# Patient Record
Sex: Male | Born: 1975 | Race: Black or African American | Hispanic: No | Marital: Married | Smoking: Current every day smoker
Health system: Southern US, Community
[De-identification: ages and names within clinical notes are randomized; demographics above are authoritative.]

## PROBLEM LIST (undated history)

## (undated) HISTORY — PX: APPENDECTOMY: SHX54

---

## 2004-02-13 ENCOUNTER — Observation Stay (HOSPITAL_COMMUNITY): Admission: EM | Admit: 2004-02-13 | Discharge: 2004-02-14 | Payer: Self-pay | Admitting: Emergency Medicine

## 2004-02-13 ENCOUNTER — Encounter (INDEPENDENT_AMBULATORY_CARE_PROVIDER_SITE_OTHER): Payer: Self-pay | Admitting: *Deleted

## 2006-04-22 ENCOUNTER — Emergency Department (HOSPITAL_COMMUNITY): Admission: EM | Admit: 2006-04-22 | Discharge: 2006-04-22 | Payer: Self-pay | Admitting: Emergency Medicine

## 2007-03-20 ENCOUNTER — Emergency Department (HOSPITAL_COMMUNITY): Admission: EM | Admit: 2007-03-20 | Discharge: 2007-03-20 | Payer: Self-pay | Admitting: Emergency Medicine

## 2007-08-28 ENCOUNTER — Emergency Department (HOSPITAL_COMMUNITY): Admission: EM | Admit: 2007-08-28 | Discharge: 2007-08-29 | Payer: Self-pay | Admitting: Emergency Medicine

## 2008-01-28 IMAGING — CT CT HEAD W/O CM
1 of 2 series · 16 of 30 positions shown, 20 images · IV contrast (agent unspecified)
Comparison: None.

CLINICAL DATA: 30-year-old male with headache.
 HEAD CT WITHOUT CONTRAST:
TECHNIQUE: Contiguous axial images were obtained from the base of the skull through the vertex according to standard protocol without contrast.

[Series 3: recon 2: brain · axial · 0.47mm/px · z∈[+155,+283]mm · 16 of 56 slices shown, 20 images]
[im 3/56  brain]
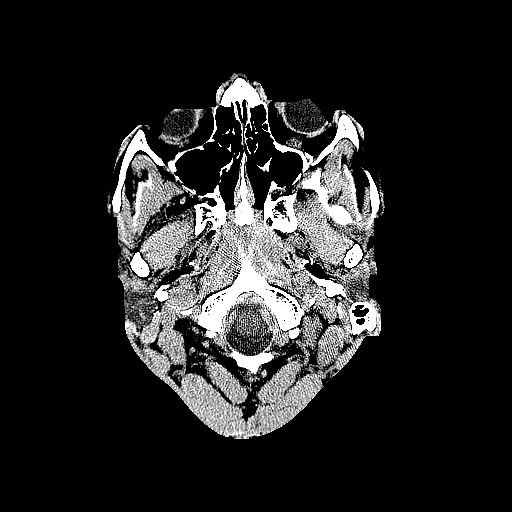
[im 3/56  bone]
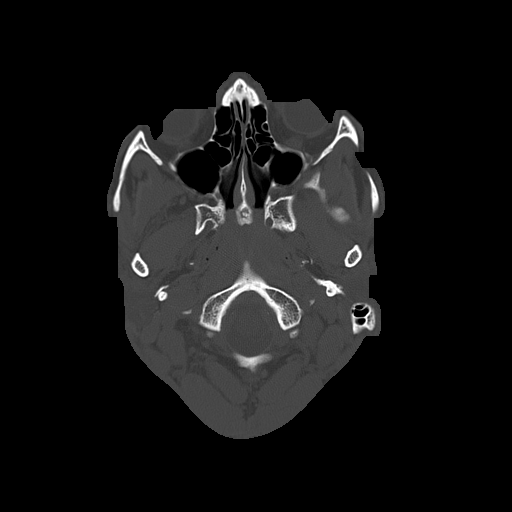
[im 6/56  brain]
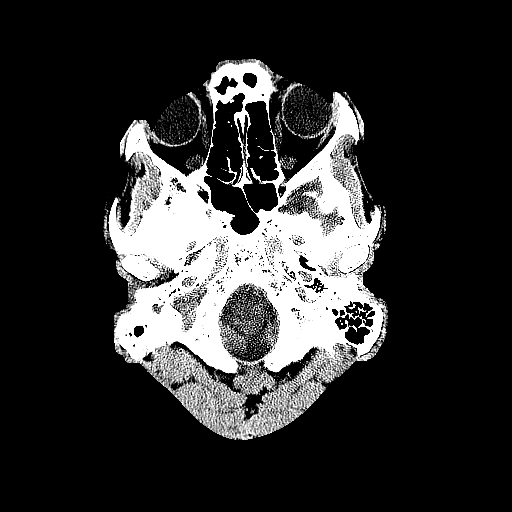
[im 9/56  brain]
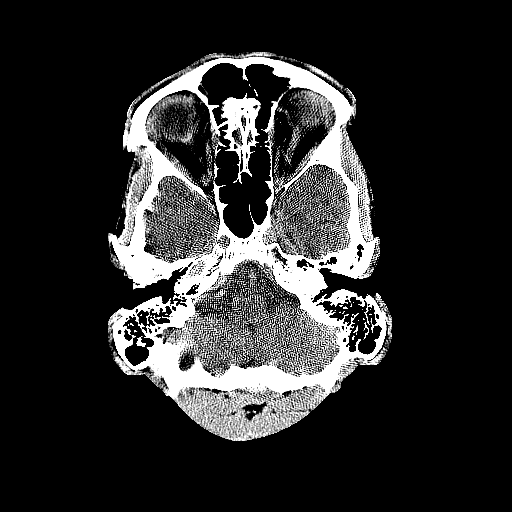
[im 12/56  brain]
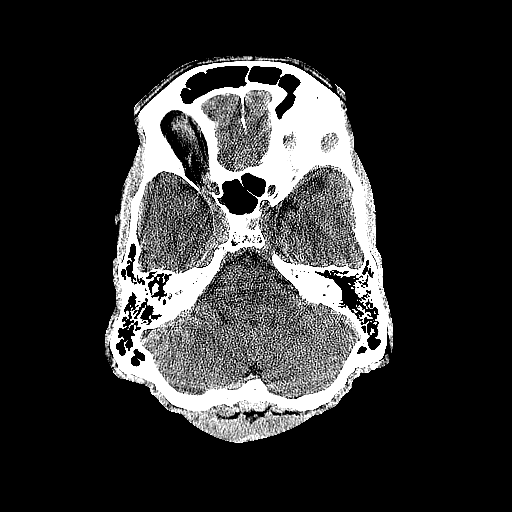
[im 18/56  brain]
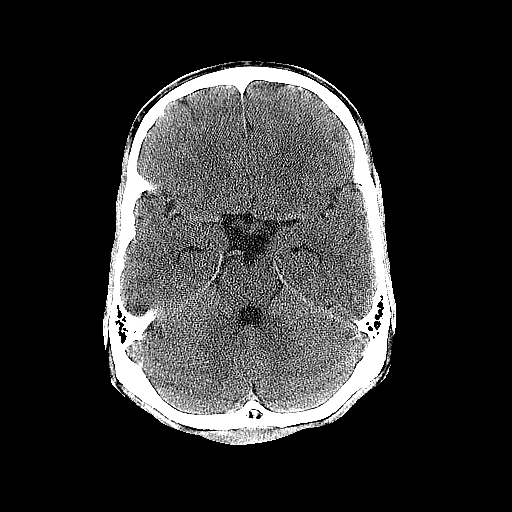
[im 18/56  bone]
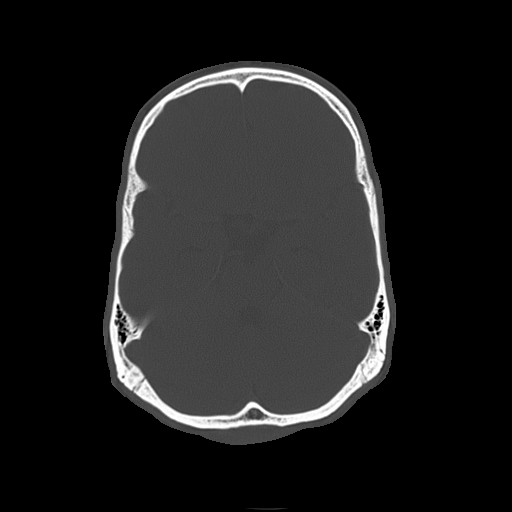
[im 21/56  brain]
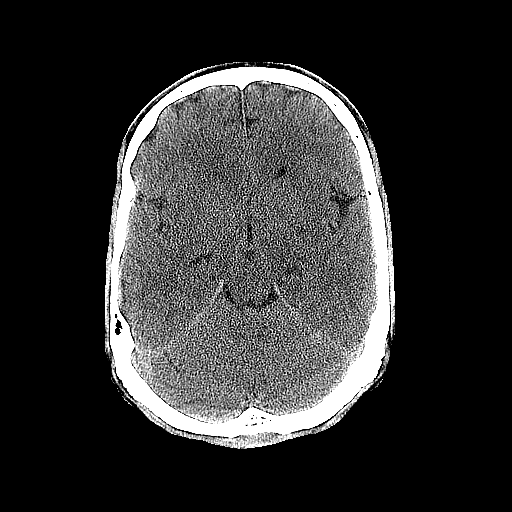
[im 24/56  brain]
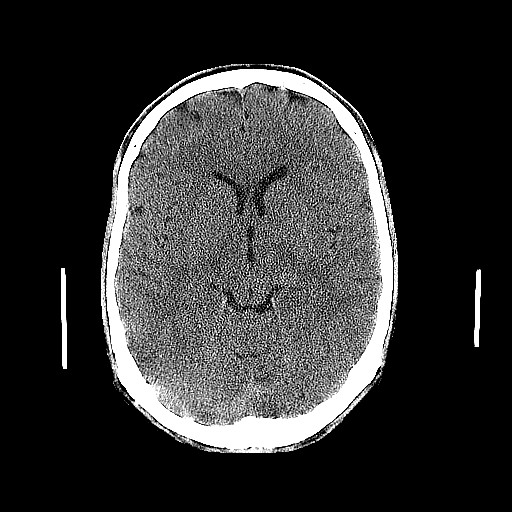
[im 27/56  brain]
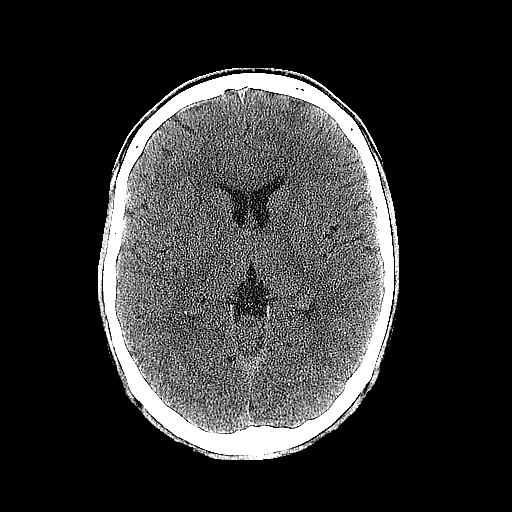
[im 29/56  brain]
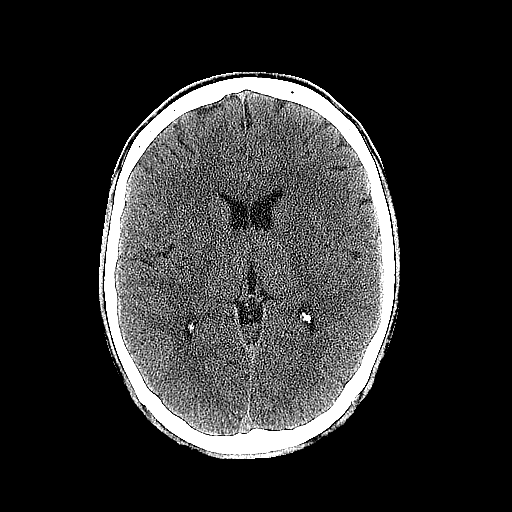
[im 29/56  bone]
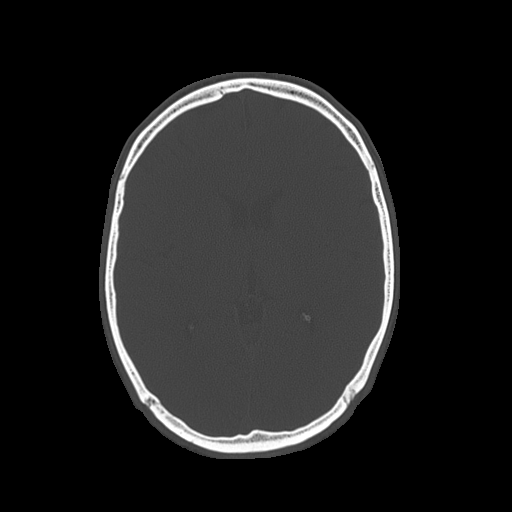
[im 32/56  brain]
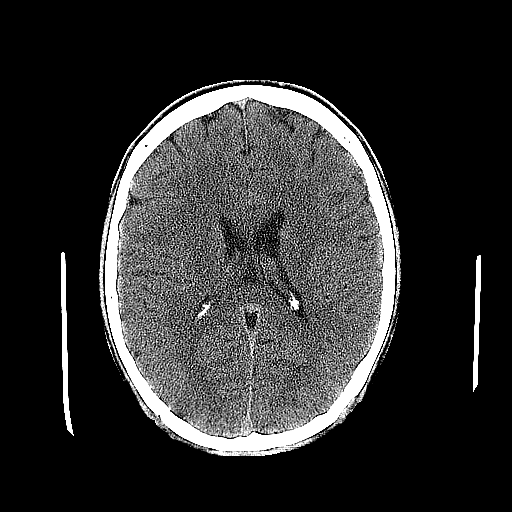
[im 35/56  brain]
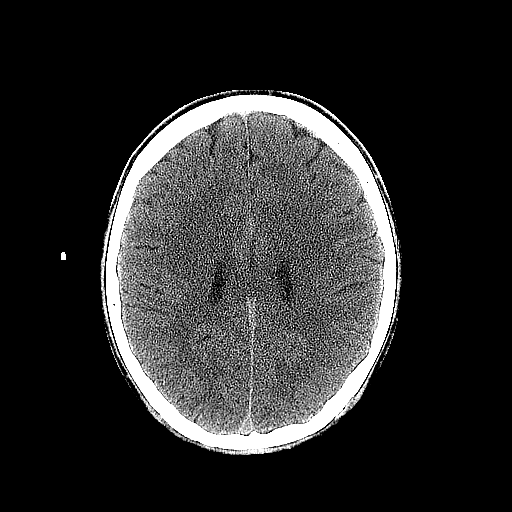
[im 38/56  brain]
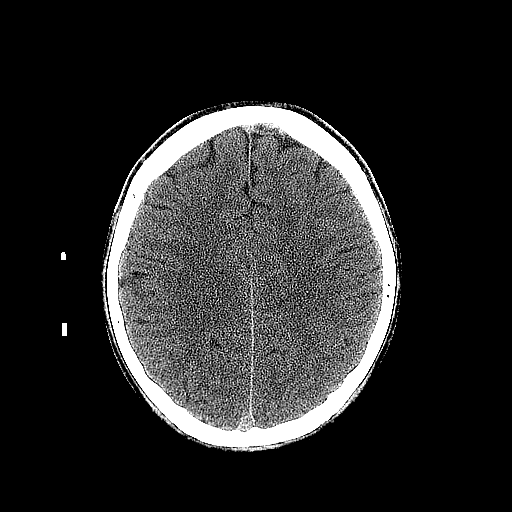
[im 44/56  brain]
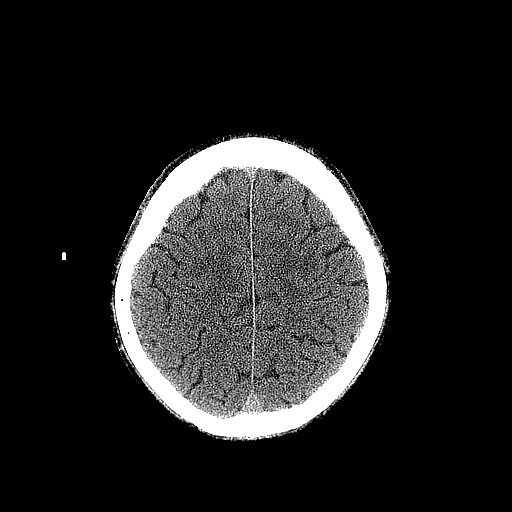
[im 44/56  bone]
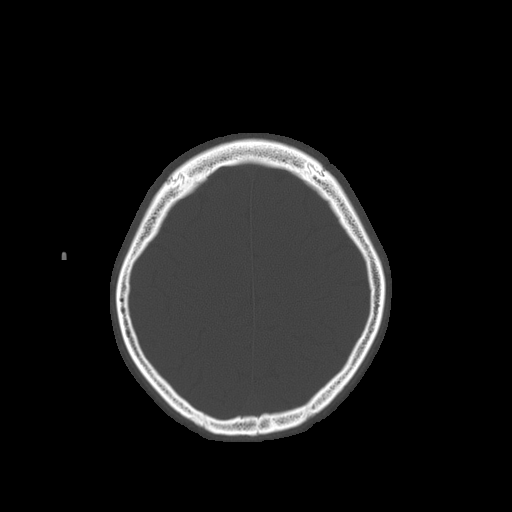
[im 47/56  brain]
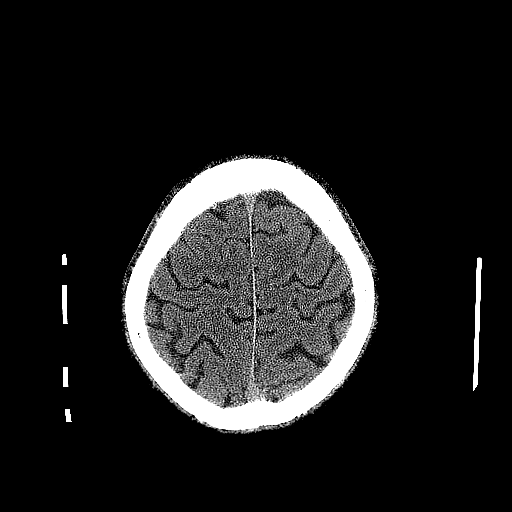
[im 50/56  brain]
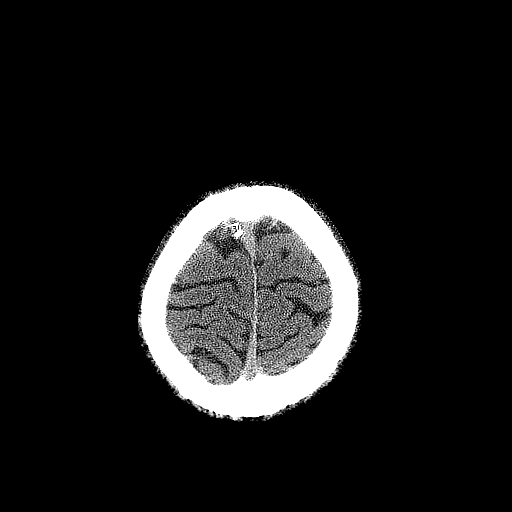
[im 53/56  brain]
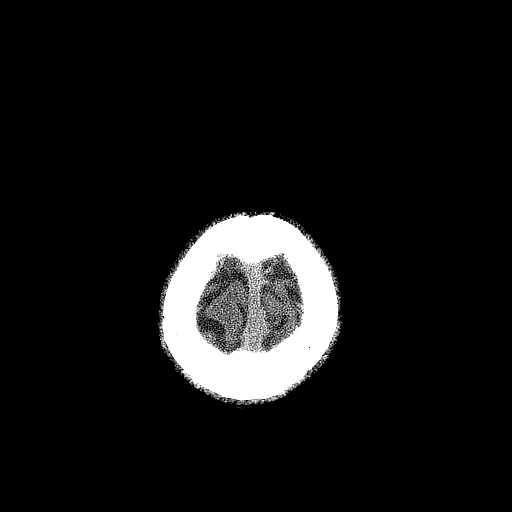

[16 of 30 positions shown; findings below may reference images not displayed]

FINDINGS: No acute intracranial abnormalities are present.  Specifically, there is no evidence for acute infarct, hemorrhage, mass, hydrocephalus, or extraaxial fluid collection. 
 The paranasal sinuses and mastoid air cells are clear from opacification of what is likely Haller?s cell on the left.
IMPRESSION: 1.  No acute intracranial abnormality. 
 2.  Opacification of a Haller?s cell in the left ethmoid.

## 2008-12-25 IMAGING — CR DG CHEST 2V
2 series · 2 of 2 positions shown · non-contrast
Comparison: none

CLINICAL DATA: Left-sided chest pain.  
 CHEST - 2 VIEW:

[w chest pa]
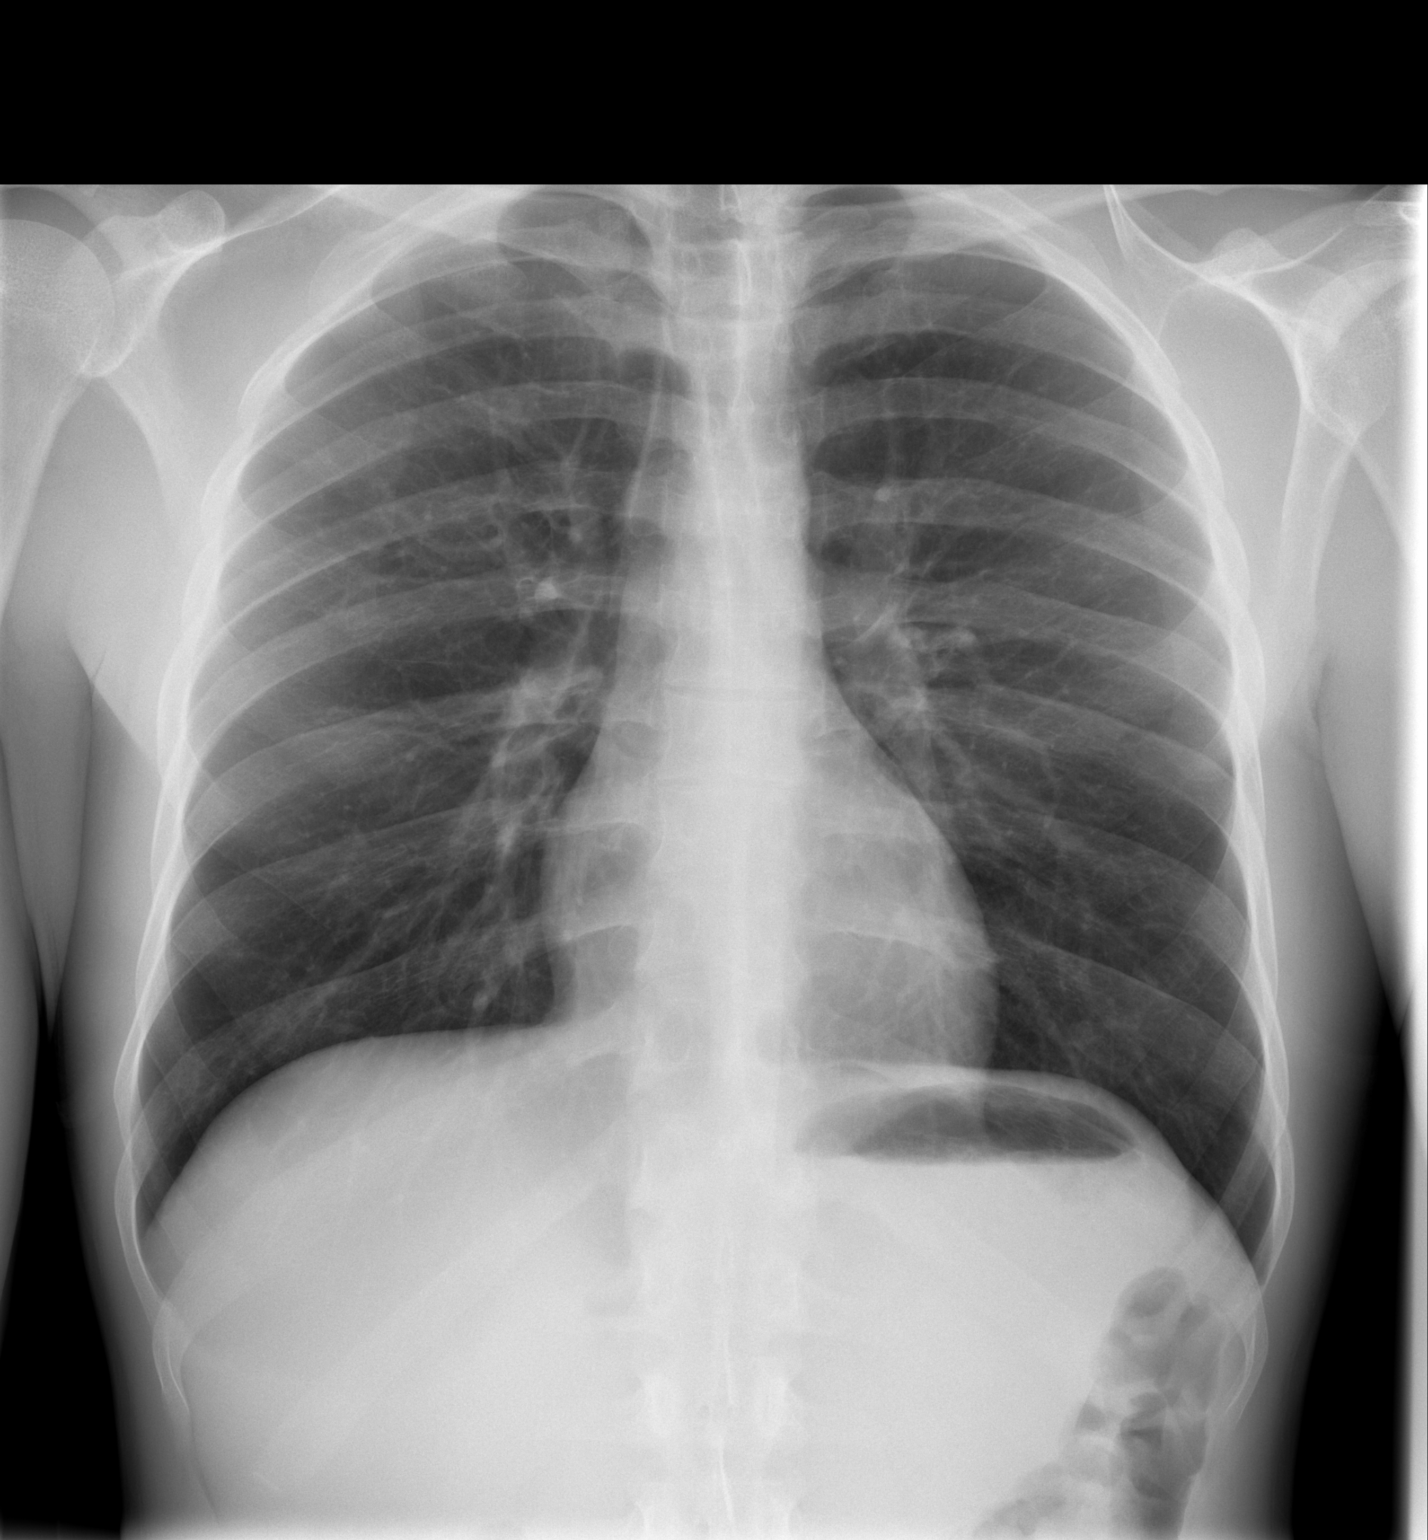

[w chest lat]
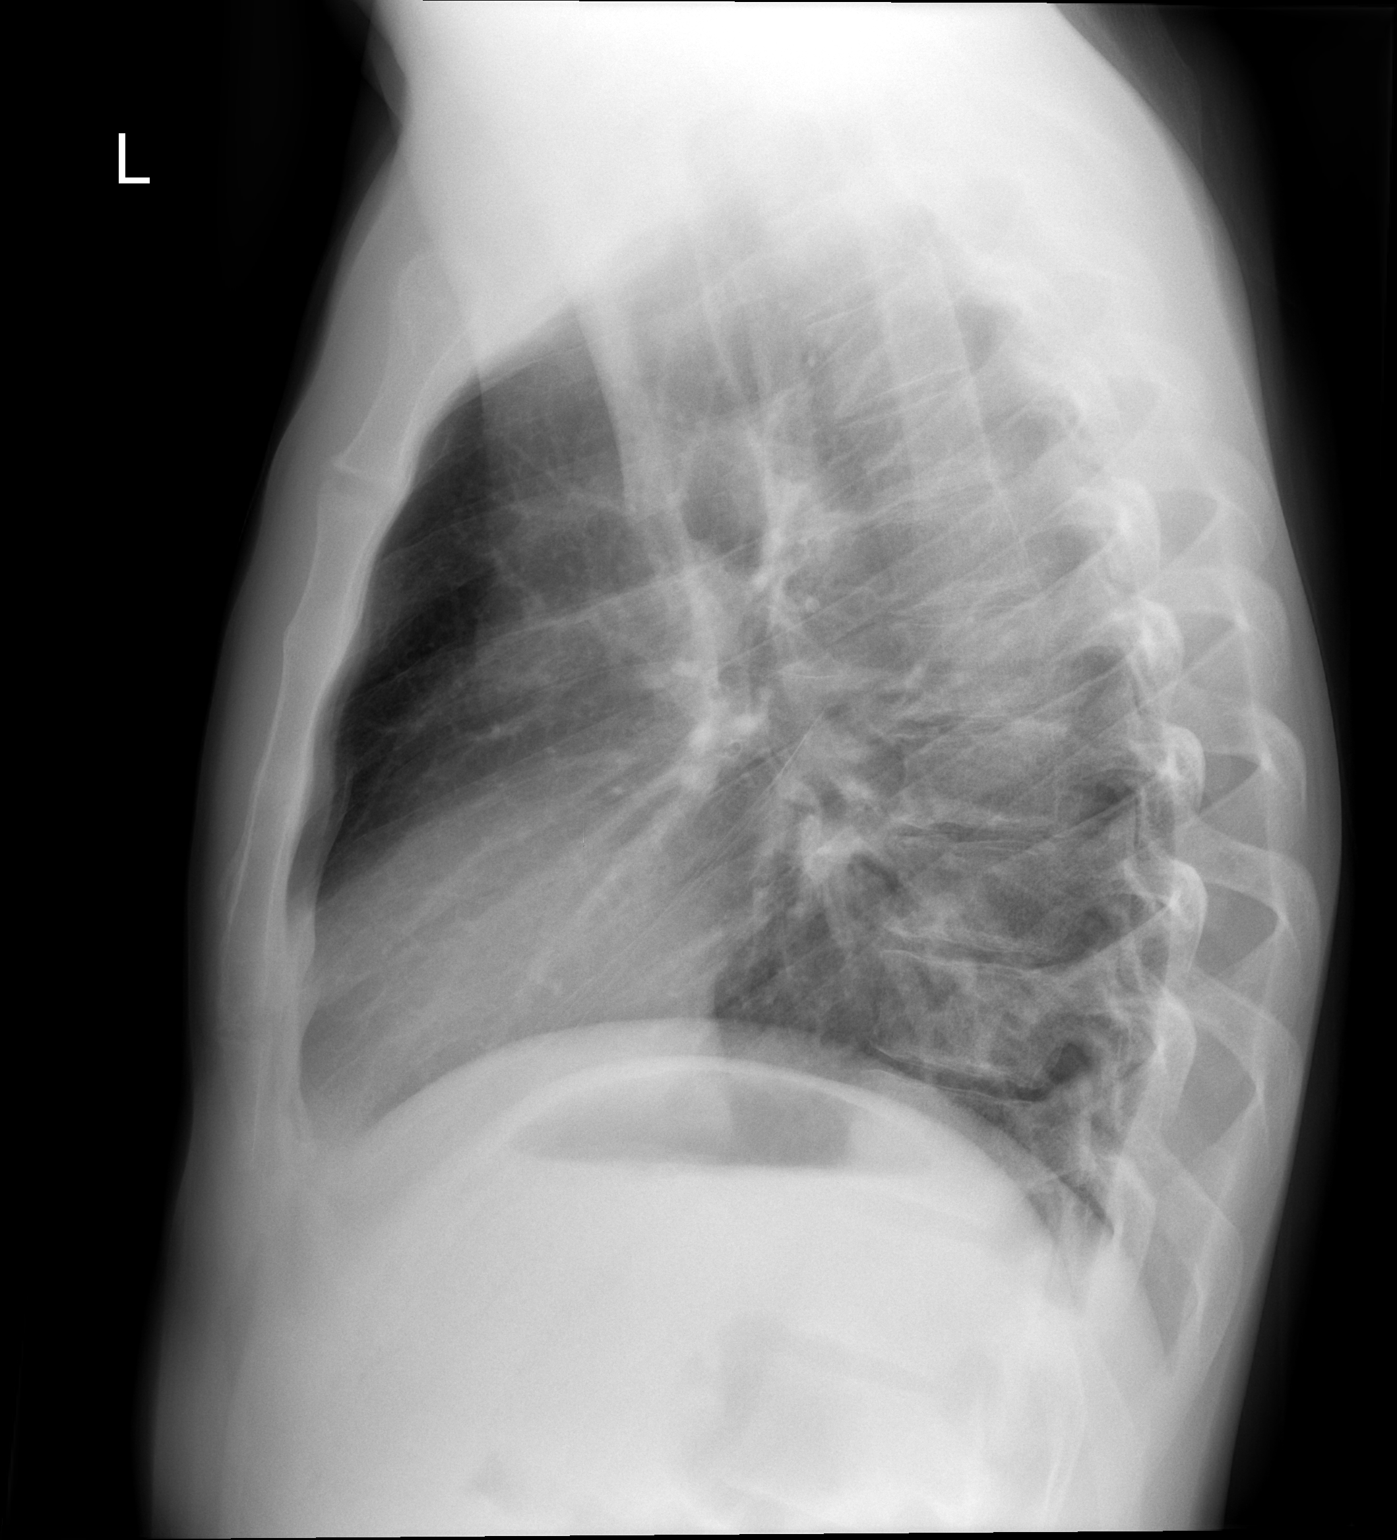

[2 of 2 positions shown; findings below may reference images not displayed]

FINDINGS: The heart size and mediastinal contours are within normal limits.  Both lungs are clear.  The visualized skeletal structures are unremarkable.
IMPRESSION: No active cardiopulmonary disease.

## 2009-05-11 ENCOUNTER — Emergency Department (HOSPITAL_COMMUNITY): Admission: EM | Admit: 2009-05-11 | Discharge: 2009-05-11 | Payer: Self-pay | Admitting: Emergency Medicine
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
## 2010-08-18 NOTE — Op Note (Signed)
Frederick Hutchinson, Frederick Hutchinson             ACCOUNT NO.:  [id number]   MEDICAL RECORD NO.:  [id number]          PATIENT TYPE:  INP   LOCATION:  0470                         [facility]ACILITY:  [facility]   PHYSICIAN:  Lorre Munroe., M.D.DATE OF BIRTH:  1976-03-13   DATE OF PROCEDURE:  02/13/2004  DATE OF DISCHARGE:                                 OPERATIVE REPORT   PREOPERATIVE DIAGNOSIS:  Acute appendicitis.   POSTOPERATIVE DIAGNOSIS:  Acute appendicitis.   PROCEDURE:  Laparoscopic appendectomy.   SURGEON:  Lebron Conners, M.D.   ANESTHESIA:  General.   COMPLICATIONS:  None.   DESCRIPTION OF PROCEDURE:  After the patient was monitored and anesthetized,  and had Foley catheter and routine preparation and draping of the abdomen,  infiltrated with local anesthetic in the lower midline just above the pubis,  just below the umbilicus and in the right upper quadrant.  I made an  incision transversely just below the umbilicus about 2 cm in length and  dissected down to the fascia, and opened it longitudinally and bluntly  entered the peritoneum.  I secured the Hasson cannula with a pursestring of  0-Vicryl suture and inflated the abdomen with CO2.  There was obvious  inflammation in the right lower quadrant and there was cloudy fluid in the  pelvis, but there was not generalized peritonitis. The remainder of the  viscera appeared normal.   I put in a 5 mm right upper quadrant port and 11 mm lower midline port under  direct vision, and then dissected the adherent fat away from the inflamed  appendix in the right lower quadrant.  It was adherent to the lateral  abdominal wall, and I took that down with the Harmonic Scalpel.  I then  thinned out the mesentery of the appendix a little bit, stapled across the  mesentery and the healthy-appearing base of the appendix with endoscopic  cutting stapler.  That produced a secure amputation of the appendix, and  then checked and saw that hemostasis was good.  I  removed the appendix from  the body in a plastic pouch through the umbilical incision and tied the  pursestring suture.  Prior to doing that, I copiously irrigated the  operative area and pelvis and removed the fluid.  Sponge, instrument and  needle counts were correct.  Hemostasis was good.   I removed the right upper quadrant port under direct vision and then allowed  the CO2 to escape, and removed the lower midline port.  I closed all the  skin incisions with intracuticular 4-0 Vicryl and Steri-Strips.  He  tolerated it well.    WB/MEDQ  D:  02/13/2004  T:  02/13/2004  Job:  [id number]

## 2010-09-25 ENCOUNTER — Emergency Department (HOSPITAL_COMMUNITY)
Admission: EM | Admit: 2010-09-25 | Discharge: 2010-09-25 | Disposition: A | Discharge status: Home / Self Care | Payer: Self-pay | Attending: Emergency Medicine | Admitting: Emergency Medicine

## 2010-09-25 DIAGNOSIS — Y9367 Activity, basketball: Secondary | ICD-10-CM | POA: Insufficient documentation

## 2010-09-25 DIAGNOSIS — Y92838 Other recreation area as the place of occurrence of the external cause: Secondary | ICD-10-CM | POA: Insufficient documentation

## 2010-09-25 DIAGNOSIS — Y9239 Other specified sports and athletic area as the place of occurrence of the external cause: Secondary | ICD-10-CM | POA: Insufficient documentation

## 2010-09-25 DIAGNOSIS — S0180XA Unspecified open wound of other part of head, initial encounter: Secondary | ICD-10-CM | POA: Insufficient documentation

## 2010-09-25 DIAGNOSIS — W219XXA Striking against or struck by unspecified sports equipment, initial encounter: Secondary | ICD-10-CM | POA: Insufficient documentation

## 2010-10-02 ENCOUNTER — Emergency Department (HOSPITAL_COMMUNITY)
Admission: EM | Admit: 2010-10-02 | Discharge: 2010-10-02 | Disposition: A | Discharge status: Home / Self Care | Payer: Self-pay | Attending: Emergency Medicine | Admitting: Emergency Medicine

## 2010-10-02 DIAGNOSIS — Z4802 Encounter for removal of sutures: Secondary | ICD-10-CM | POA: Insufficient documentation

## 2010-12-27 LAB — COMPREHENSIVE METABOLIC PANEL
ALT: 23
AST: 27
Albumin: 4.9
Alkaline Phosphatase: 65
BUN: 9
CO2: 24
Calcium: 9.5
Chloride: 104
Creatinine, Ser: 0.95
GFR calc Af Amer: >60
GFR calc non Af Amer: >60
Glucose, Bld: 103 — ABNORMAL HIGH
Potassium: 4.1
Sodium: 139
Total Bilirubin: 0.6
Total Protein: 7.5

## 2010-12-27 LAB — CBC
HCT: 46.7
Hemoglobin: 15.8
MCHC: 33.8
MCV: 94.7
Platelets: 241
RBC: 4.93
RDW: 13
WBC: 9.8

## 2010-12-27 LAB — URINALYSIS, ROUTINE W REFLEX MICROSCOPIC
Bilirubin Urine: NEGATIVE
Glucose, UA: NEGATIVE
Hgb urine dipstick: NEGATIVE
Ketones, ur: NEGATIVE
Nitrite: NEGATIVE
Protein, ur: NEGATIVE
Specific Gravity, Urine: <1.005 — ABNORMAL LOW
Urobilinogen, UA: 0.2
pH: 5

## 2010-12-27 LAB — DIFFERENTIAL
Basophils Absolute: 0.1
Basophils Relative: 1
Eosinophils Absolute: 0
Eosinophils Relative: 0
Lymphocytes Relative: 23
Lymphs Abs: 2.3
Monocytes Absolute: 0.5
Monocytes Relative: 5
Neutro Abs: 7
Neutrophils Relative %: 71

## 2010-12-27 LAB — ABO/RH: ABO/RH(D): O NEG

## 2010-12-27 LAB — OCCULT BLOOD X 1 CARD TO LAB, STOOL: Fecal Occult Bld: NEGATIVE

## 2010-12-27 LAB — TYPE AND SCREEN
ABO/RH(D): O NEG
Antibody Screen: NEGATIVE

## 2010-12-27 LAB — LIPASE, BLOOD: Lipase: 23

## 2011-01-05 LAB — POCT CARDIAC MARKERS
CKMB, poc: 1.4
Myoglobin, poc: 170
Operator id: 208821
Troponin i, poc: <0.05

## 2011-01-05 LAB — I-STAT 8, (EC8 V) (CONVERTED LAB)
Acid-base deficit: 2
BUN: 20
Bicarbonate: 23.8
Chloride: 107
Glucose, Bld: 89
HCT: 43
Hemoglobin: 14.6
Operator id: 208821
Potassium: 3.7
Sodium: 140
TCO2: 25
pCO2, Ven: 45.2
pH, Ven: 7.329 — ABNORMAL HIGH

## 2011-01-05 LAB — POCT I-STAT CREATININE
Creatinine, Ser: 1.1
Operator id: 208821

## 2011-02-16 IMAGING — CR DG CHEST 2V
2 series · 2 of 2 positions shown · non-contrast
Comparison: 03/20/2007

CLINICAL DATA: Cough, chest pain and fever.

CHEST - 2 VIEW

[w chest pa]
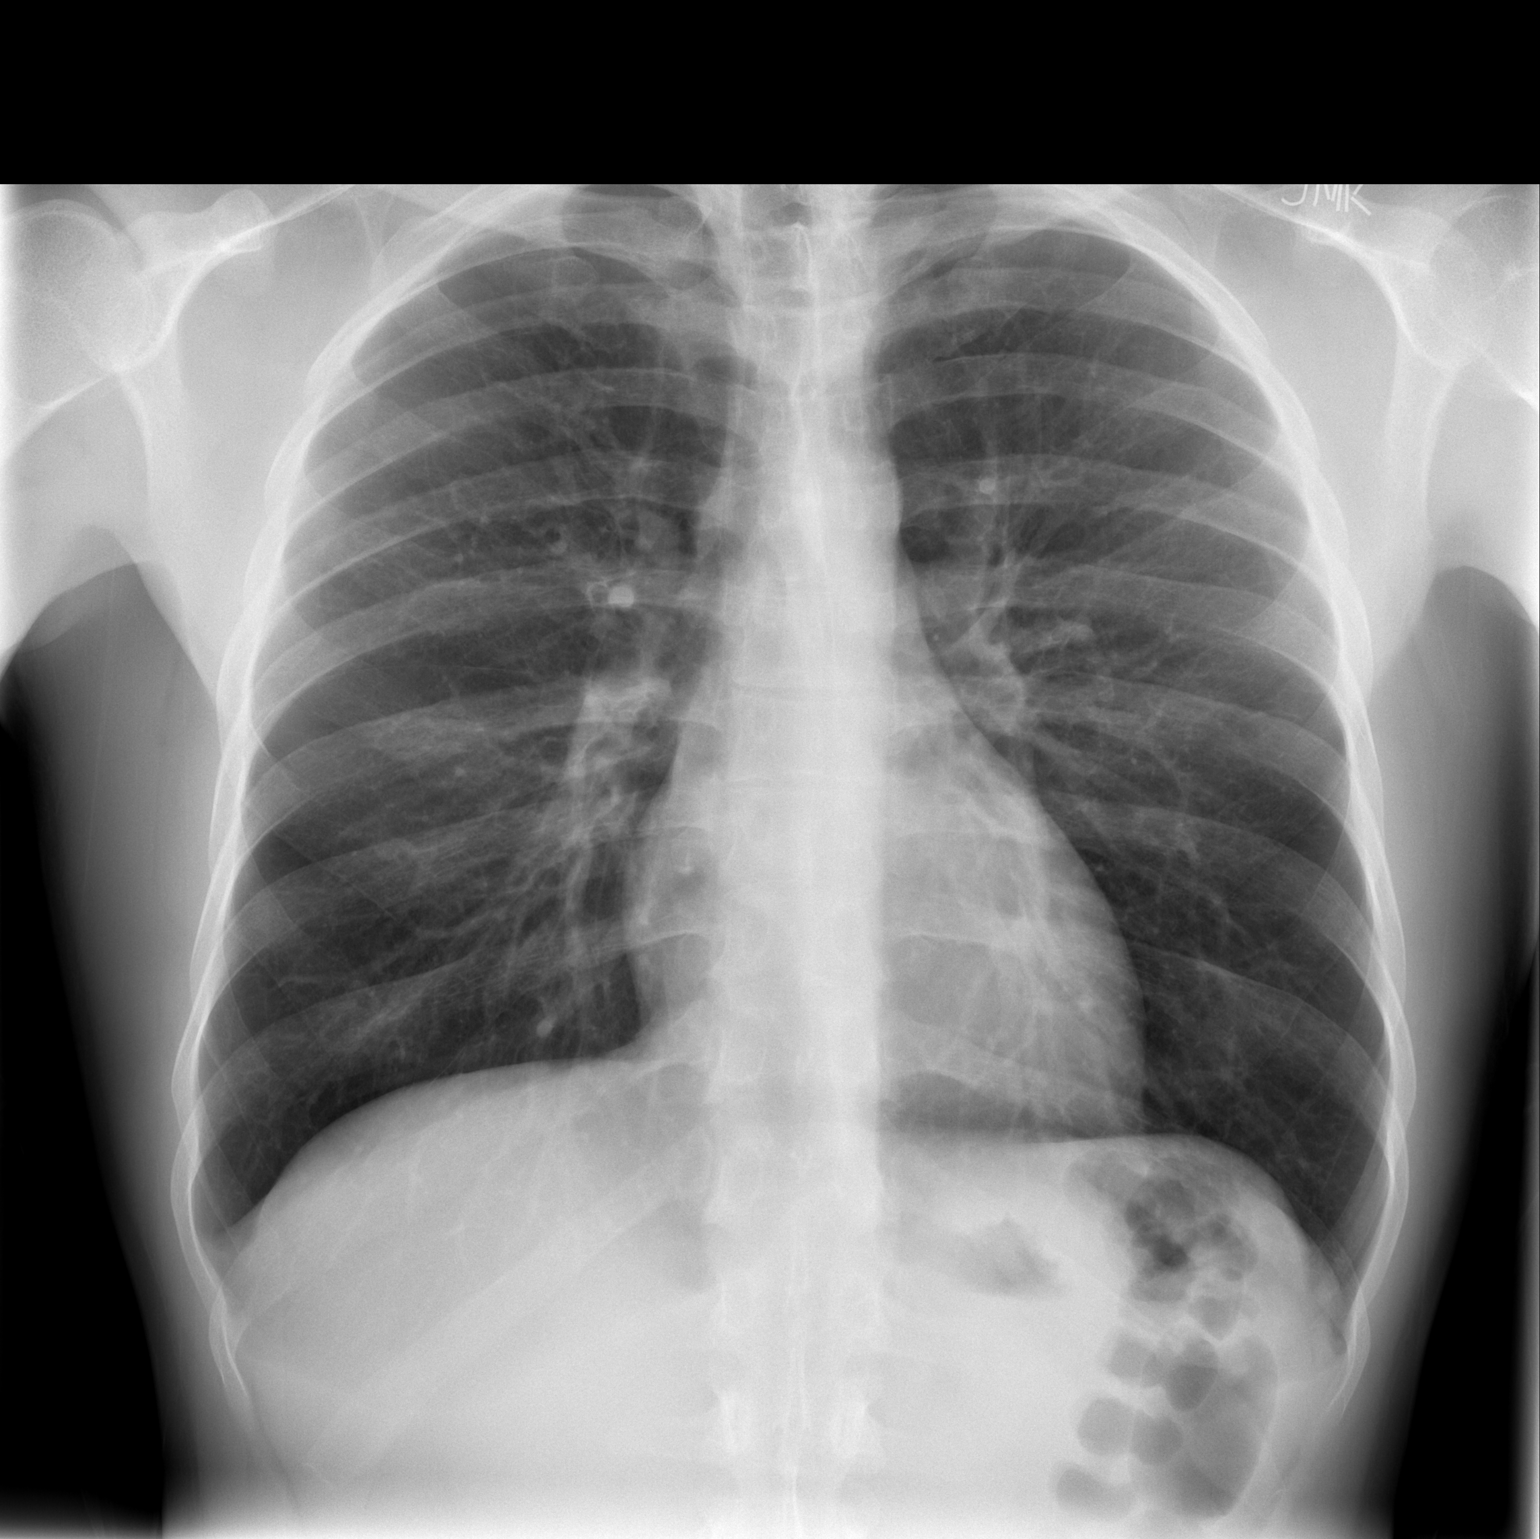

[w chest lat]
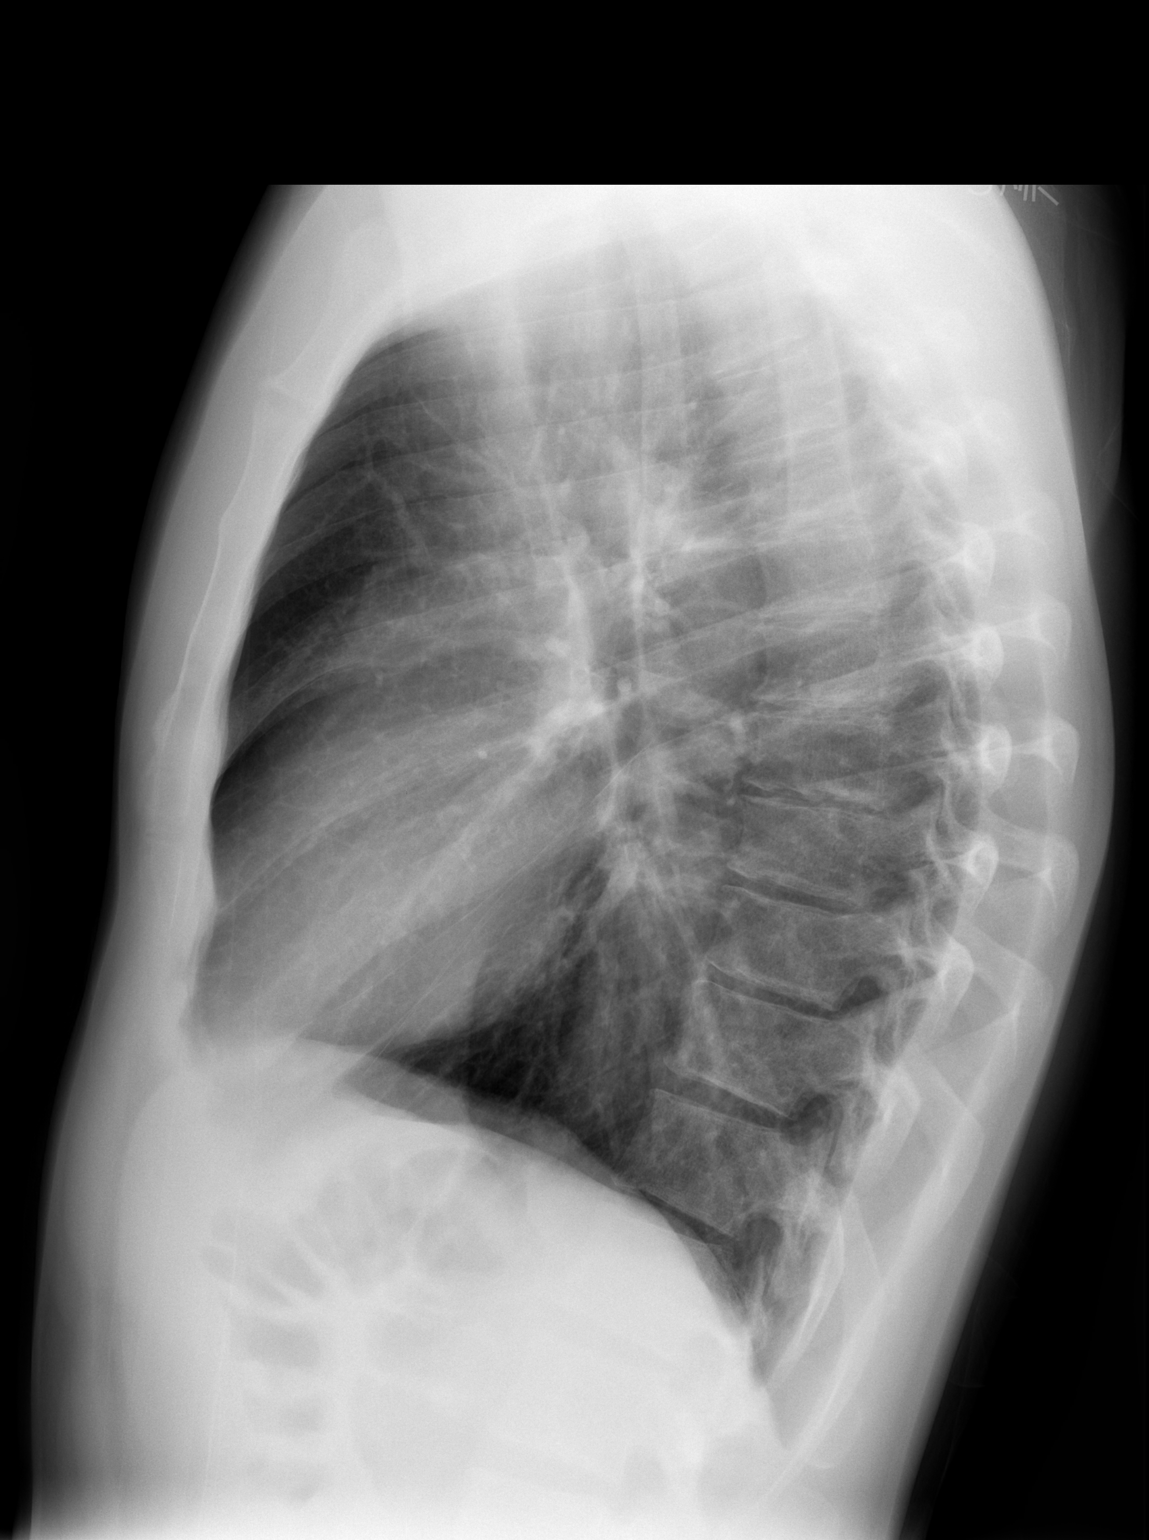

[2 of 2 positions shown; findings below may reference images not displayed]

FINDINGS: The heart size and mediastinal contours are within normal
limits.  Both lungs are clear.  The visualized skeletal structures
are unremarkable.
IMPRESSION: No active disease.

## 2011-03-23 ENCOUNTER — Encounter: Payer: Self-pay | Admitting: *Deleted

## 2011-03-23 ENCOUNTER — Emergency Department (HOSPITAL_COMMUNITY)
Admission: EM | Admit: 2011-03-23 | Discharge: 2011-03-23 | Disposition: A | Discharge status: Home / Self Care | Payer: Self-pay | Attending: Emergency Medicine | Admitting: Emergency Medicine

## 2011-03-23 ENCOUNTER — Emergency Department (HOSPITAL_COMMUNITY): Payer: Self-pay

## 2011-03-23 DIAGNOSIS — R079 Chest pain, unspecified: Secondary | ICD-10-CM | POA: Insufficient documentation

## 2011-03-23 DIAGNOSIS — R0602 Shortness of breath: Secondary | ICD-10-CM | POA: Insufficient documentation

## 2011-03-23 DIAGNOSIS — R059 Cough, unspecified: Secondary | ICD-10-CM | POA: Insufficient documentation

## 2011-03-23 DIAGNOSIS — R21 Rash and other nonspecific skin eruption: Secondary | ICD-10-CM | POA: Insufficient documentation

## 2011-03-23 DIAGNOSIS — R05 Cough: Secondary | ICD-10-CM | POA: Insufficient documentation

## 2011-03-23 MED ORDER — DIPHENHYDRAMINE HCL 25 MG PO CAPS: 25.0000 mg | ORAL_CAPSULE | Freq: Four times a day (QID) | ORAL | Status: DC | PRN

## 2011-03-23 MED ORDER — PREDNISONE 10 MG PO TABS: 20.0000 mg | ORAL_TABLET | Freq: Every day | ORAL | Status: DC

## 2011-03-23 MED ORDER — PREDNISONE 20 MG PO TABS
60.0000 mg | ORAL_TABLET | Freq: Once | ORAL | Status: AC
  Administered 2011-03-23: 60 mg via ORAL
  Filled 2011-03-23: qty 3

## 2011-03-23 NOTE — ED Provider Notes (Signed)
Medical screening examination/treatment/procedure(s) were performed by non-physician practitioner and as supervising physician I was immediately available for consultation/collaboration.  Raeford Razor, MD 03/23/11 [phone]

## 2011-03-23 NOTE — ED Notes (Addendum)
Pt in c/o rash since Tuesday, states he was cleaning out a house the other day and isn't sure what all he was exposed to, also cough

## 2011-03-23 NOTE — ED Provider Notes (Signed)
History   Pt sts 4 days ago he was working on a house that was quite dusty.  He then subsequently noticing itchy rash throughout body the next day.  Rash affecting both forearms and to chest and back.  Rash is not painful.  Yesterday he has notice chest congestion follows with cough productive with streaks of blood ( a few teaspoon full).  He has taken some benadryl without adequate relief.  Denies fever, headache, numbness or weakness.    CSN: [id number]  Arrival date & time 03/23/11  1912   None     Chief Complaint  Patient presents with  . Rash    (Consider location/radiation/quality/duration/timing/severity/associated sxs/prior treatment) HPI  No past medical history on file.  No past surgical history on file.  No family history on file.  History  Substance Use Topics  . Smoking status: Current Everyday Smoker  . Smokeless tobacco: Not on file  . Alcohol Use: Yes      Review of Systems  All other systems reviewed and are negative.    Allergies  Review of patient's allergies indicates no known allergies.  Home Medications  No current outpatient prescriptions on file.  There were no vitals taken for this visit.  Physical Exam  Constitutional: He appears well-developed and well-nourished. No distress.  HENT:  Head: Normocephalic and atraumatic.  Mouth/Throat: Oropharynx is clear and moist. No oropharyngeal exudate.  Eyes: Conjunctivae are normal.  Neck: Normal range of motion. Neck supple.  Cardiovascular: Normal rate and regular rhythm.   Pulmonary/Chest: No respiratory distress. He has no wheezes. He has no rales. He exhibits no tenderness.  Abdominal: Soft.  Lymphadenopathy:    He has no cervical adenopathy.  Skin: Skin is warm and dry. No petechiae and no rash noted. Rash is not pustular.    ED Course  Procedures (including critical care time)  Labs Reviewed - No data to display No results found.   No diagnosis found.    MDM  No obvious  rash noted, mild redness and excoriation noted on both forearm. Rash is non vesicular, non pustular, and non petechiae. Rash may likely reflect hives that has improved.  Pt  With VSS.  Throat exam is unremarkable.  Will obtain CXR to r/o inhalational bronchitis, or PNA.     9:38 PM Normal CXR, vital sign stable, pt in NAD.  Will prescribed prednisone and benadryl.  Doubt contact dermatitis, pt likely having mild allergic rxn.       Fayrene Helper, [place] 03/23/11 2145

## 2011-04-16 ENCOUNTER — Encounter (HOSPITAL_COMMUNITY): Payer: Self-pay | Admitting: *Deleted

## 2011-04-16 ENCOUNTER — Emergency Department (HOSPITAL_COMMUNITY)
Admission: EM | Admit: 2011-04-16 | Discharge: 2011-04-16 | Disposition: A | Discharge status: Home / Self Care | Payer: Self-pay | Attending: Emergency Medicine | Admitting: Emergency Medicine

## 2011-04-16 DIAGNOSIS — L509 Urticaria, unspecified: Secondary | ICD-10-CM | POA: Insufficient documentation

## 2011-04-16 DIAGNOSIS — F17200 Nicotine dependence, unspecified, uncomplicated: Secondary | ICD-10-CM | POA: Insufficient documentation

## 2011-04-16 MED ORDER — PREDNISONE 20 MG PO TABS: 60.0000 mg | ORAL_TABLET | Freq: Every day | ORAL | Status: DC

## 2011-04-16 NOTE — ED Notes (Signed)
Pt states "I've been breaking out for a month, I use the cortisone & it goes away but then comes right back"; pt denies introduction of new possible allergens.

## 2011-04-16 NOTE — ED Provider Notes (Signed)
History     CSN: [id number]  Arrival date & time 04/16/11  0807   First MD Initiated Contact with Patient 04/16/11 [phone]      Chief Complaint  Patient presents with  . Allergic Reaction    (Consider location/radiation/quality/duration/timing/severity/associated sxs/prior treatment) HPI 36 year old male has been having problems with a rash for the last month. Rash is per reticulocyte. It waxes and wanes but has been persistent. He was seen in the emergency department and given a course of prednisone which did not seem to help. The he has been using hydrocortisone cream which does give temporary relief. Has also been taking Benadryl which gives temporary relief. He denies any difficulty breathing or swallowing. He has had no new exposures specifically denying head in a new medication.  History reviewed. No pertinent past medical history.  Past Surgical History  Procedure Date  . Appendectomy     No family history on file.  History  Substance Use Topics  . Smoking status: Current Everyday Smoker -- 0.5 packs/day  . Smokeless tobacco: Not on file  . Alcohol Use: Yes     ocassionally      Review of Systems  Allergies  Review of patient's allergies indicates no known allergies.  Home Medications   Current Outpatient Rx  Name Route Sig Dispense Refill  . DIPHENHYDRAMINE HCL 25 MG PO CAPS Oral Take 25 mg by mouth every 6 (six) hours as needed. itching    . PREDNISONE 20 MG PO TABS Oral Take 3 tablets (60 mg total) by mouth daily. 15 tablet 0    BP 134/65  Pulse 91  Temp(Src) 98.8 F (37.1 C) (Oral)  Resp 24  Wt 150 lb (68.04 kg)  SpO2 99%  Physical Exam 36 year old male who is resting comfortably and in no acute distress. Vital signs show mild tachypnea with respiratory rate of 24. Oxygen saturation is 99% which is normal. Head is normocephalic and atraumatic. PERRLA, EOMI. Oropharynx is clear. Neck is supple without adenopathy or tenderness. Back is nontender. Lungs  are clear without rales, wheezes, rhonchi. Heart has regular rate rhythm without murmur. Abdomen is soft, flat, nontender without masses or hepatosplenomegaly. Extremities have full range of motion, no cyanosis or edema. Skin has scattered urticarial lesions predominantly over the trunk but also on all extremities and on the neck. Neurologic: Mental status is normal, cranial nerves are intact, there no focal motor or sensory deficits. Psychiatric: No abnormalities of mood or affect. ED Course  Procedures (including critical care time)  Labs Reviewed - No data to display No results found.   1. Urticaria       MDM  Urticaria with precipitating cause not clear. Management of urticaria is discussed with patient and with his spouse. He was on a relatively low dose of prednisone previously. He will be given a five-day course of prednisone 60 mg a day to try and suppress reaction, but he he is advised that it will probably start to come back after finishing the prednisone and he will likely need to be on long-term suppressive therapy with second-generation antihistamines. He is referred to [facility] dermatology and advised that he may benefit from allergy testing to try and identify what is causing his outbreaks.        Dione Booze, MD 04/16/11 [phone]

## 2012-05-13 ENCOUNTER — Emergency Department (HOSPITAL_COMMUNITY)
Admission: EM | Admit: 2012-05-13 | Discharge: 2012-05-13 | Disposition: A | Discharge status: Home / Self Care | Payer: Self-pay | Attending: Emergency Medicine | Admitting: Emergency Medicine

## 2012-05-13 ENCOUNTER — Encounter (HOSPITAL_COMMUNITY): Payer: Self-pay | Admitting: Emergency Medicine

## 2012-05-13 DIAGNOSIS — R109 Unspecified abdominal pain: Secondary | ICD-10-CM | POA: Insufficient documentation

## 2012-05-13 DIAGNOSIS — R5383 Other fatigue: Secondary | ICD-10-CM | POA: Insufficient documentation

## 2012-05-13 DIAGNOSIS — K529 Noninfective gastroenteritis and colitis, unspecified: Secondary | ICD-10-CM

## 2012-05-13 DIAGNOSIS — Z9089 Acquired absence of other organs: Secondary | ICD-10-CM | POA: Insufficient documentation

## 2012-05-13 DIAGNOSIS — F17200 Nicotine dependence, unspecified, uncomplicated: Secondary | ICD-10-CM | POA: Insufficient documentation

## 2012-05-13 DIAGNOSIS — K5289 Other specified noninfective gastroenteritis and colitis: Secondary | ICD-10-CM | POA: Insufficient documentation

## 2012-05-13 DIAGNOSIS — R197 Diarrhea, unspecified: Secondary | ICD-10-CM | POA: Insufficient documentation

## 2012-05-13 DIAGNOSIS — R5381 Other malaise: Secondary | ICD-10-CM | POA: Insufficient documentation

## 2012-05-13 LAB — CBC WITH DIFFERENTIAL/PLATELET
Basophils Absolute: 0 10*3/uL (ref 0.0–0.1)
Basophils Relative: 1 % (ref 0–1)
Eosinophils Absolute: 0.1 10*3/uL (ref 0.0–0.7)
Eosinophils Relative: 2 % (ref 0–5)
HCT: 44 % (ref 39.0–52.0)
Hemoglobin: 14.9 g/dL (ref 13.0–17.0)
Lymphocytes Relative: 39 % (ref 12–46)
Lymphs Abs: 2.4 10*3/uL (ref 0.7–4.0)
MCH: 31.5 pg (ref 26.0–34.0)
MCHC: 33.9 g/dL (ref 30.0–36.0)
MCV: 93 fL (ref 78.0–100.0)
Monocytes Absolute: 0.5 10*3/uL (ref 0.1–1.0)
Monocytes Relative: 8 % (ref 3–12)
Neutro Abs: 3.1 10*3/uL (ref 1.7–7.7)
Neutrophils Relative %: 51 % (ref 43–77)
Platelets: 250 10*3/uL (ref 150–400)
RBC: 4.73 MIL/uL (ref 4.22–5.81)
RDW: 13.1 % (ref 11.5–15.5)
WBC: 6.1 10*3/uL (ref 4.0–10.5)

## 2012-05-13 LAB — URINALYSIS, ROUTINE W REFLEX MICROSCOPIC
Bilirubin Urine: NEGATIVE
Ketones, ur: NEGATIVE mg/dL
Leukocytes, UA: NEGATIVE
Nitrite: NEGATIVE
Specific Gravity, Urine: 1.008 (ref 1.005–1.030)
Urobilinogen, UA: 0.2 mg/dL (ref 0.0–1.0)
pH: 6.5 (ref 5.0–8.0)

## 2012-05-13 LAB — COMPREHENSIVE METABOLIC PANEL
BUN: 9 mg/dL (ref 6–23)
CO2: 27 mEq/L (ref 19–32)
Calcium: 9 mg/dL (ref 8.4–10.5)
GFR calc Af Amer: >90 mL/min (ref >90)
GFR calc non Af Amer: >90 mL/min (ref >90)
Glucose, Bld: 106 mg/dL — ABNORMAL HIGH (ref 70–99)
Total Protein: 7 g/dL (ref 6.0–8.3)

## 2012-05-13 MED ORDER — PROMETHAZINE HCL 25 MG PO TABS: 25.0000 mg | ORAL_TABLET | Freq: Four times a day (QID) | ORAL | Status: AC | PRN

## 2012-05-13 MED ORDER — SODIUM CHLORIDE 0.9 % IV BOLUS (SEPSIS)
1000.0000 mL | Freq: Once | INTRAVENOUS | Status: AC
  Administered 2012-05-13: 1000 mL via INTRAVENOUS

## 2012-05-13 MED ORDER — DIPHENOXYLATE-ATROPINE 2.5-0.025 MG PO TABS: 1.0000 | ORAL_TABLET | Freq: Four times a day (QID) | ORAL | Status: AC | PRN

## 2012-05-13 MED ORDER — ONDANSETRON HCL 4 MG/2ML IJ SOLN
4.0000 mg | Freq: Once | INTRAMUSCULAR | Status: AC
  Administered 2012-05-13: 4 mg via INTRAVENOUS
  Filled 2012-05-13: qty 2

## 2012-05-13 MED ORDER — PANTOPRAZOLE SODIUM 40 MG IV SOLR
40.0000 mg | Freq: Once | INTRAVENOUS | Status: AC
  Administered 2012-05-13: 40 mg via INTRAVENOUS
  Filled 2012-05-13: qty 40

## 2012-05-13 MED ORDER — ONDANSETRON HCL 4 MG/2ML IJ SOLN: 4.0000 mg | Freq: Once | INTRAMUSCULAR | Status: DC

## 2012-05-13 MED ORDER — SODIUM CHLORIDE 0.9 % IV SOLN: 1000.0000 mL | Freq: Once | INTRAVENOUS | Status: DC

## 2012-05-13 NOTE — ED Provider Notes (Signed)
History     CSN: [id number]  Arrival date & time 05/13/12  0719   First MD Initiated Contact with Patient 05/13/12 [phone]      Chief Complaint  Patient presents with  . Nausea  . Emesis  . Weakness    (Consider location/radiation/quality/duration/timing/severity/associated sxs/prior treatment) HPI... nausea, vomiting, diarrhea for 2 days.  Wife has similar symptoms recently. Minimal suprapubic pain.  No fever, chills, dysuria, hematuria, rectal bleeding. Normal bowel movements. Has had appendectomy. No chronic health problems. Nothing makes symptoms better or worse. Severity is mild to moderate  History reviewed. No pertinent past medical history.  Past Surgical History  Procedure Laterality Date  . Appendectomy      No family history on file.  History  Substance Use Topics  . Smoking status: Current Every Day Smoker -- 0.50 packs/day  . Smokeless tobacco: Not on file  . Alcohol Use: Yes     Comment: ocassionally      Review of Systems  All other systems reviewed and are negative.    Allergies  Review of patient's allergies indicates no known allergies.  Home Medications   Current Outpatient Rx  Name  Route  Sig  Dispense  Refill  . loperamide (ANTI-DIARRHEAL) 2 MG tablet   Oral   Take 2 mg by mouth 4 (four) times daily as needed for diarrhea or loose stools.         . Multiple Vitamin (MULTIVITAMIN WITH MINERALS) TABS   Oral   Take 1 tablet by mouth every morning.         . diphenoxylate-atropine (LOMOTIL) 2.5-0.025 MG per tablet   Oral   Take 1 tablet by mouth 4 (four) times daily as needed for diarrhea or loose stools.   20 tablet   0   . promethazine (PHENERGAN) 25 MG tablet   Oral   Take 1 tablet (25 mg total) by mouth every 6 (six) hours as needed for nausea.   20 tablet   0     BP 122/83  Pulse 61  Temp(Src) 97.3 F (36.3 C) (Oral)  SpO2 100%  Physical Exam  Nursing note and vitals reviewed. Constitutional: He is oriented to  person, place, and time. He appears well-developed and well-nourished.  HENT:  Head: Normocephalic and atraumatic.  Eyes: Conjunctivae and EOM are normal. Pupils are equal, round, and reactive to light.  Neck: Normal range of motion. Neck supple.  Cardiovascular: Normal rate, regular rhythm and normal heart sounds.   Pulmonary/Chest: Effort normal and breath sounds normal.  Minimal suprapubic tenderness  Abdominal: Soft. Bowel sounds are normal.  Musculoskeletal: Normal range of motion.  Neurological: He is alert and oriented to person, place, and time.  Skin: Skin is warm and dry.  Psychiatric: He has a normal mood and affect.    ED Course  Procedures (including critical care time)  Labs Reviewed  COMPREHENSIVE METABOLIC PANEL - Abnormal; Notable for the following:    Glucose, Bld 106 (*)    All other components within normal limits  CBC WITH DIFFERENTIAL  LIPASE, BLOOD  URINALYSIS, ROUTINE W REFLEX MICROSCOPIC   No results found.   1. Gastroenteritis       MDM  No acute abdomen. Patient feeling much better after IV fluids.  Discharge meds include Lomotil #20 and Phenergan 25 mg #20        Donnetta Hutching, MD 05/13/12 1105

## 2012-05-13 NOTE — ED Notes (Signed)
Pt c/o of n/v x 2 days and weakness. States he is unable to keep anything down for the past two days has been trying to keep fluids down,

## 2018-08-11 ENCOUNTER — Other Ambulatory Visit: Payer: Self-pay

## 2018-08-11 ENCOUNTER — Encounter (HOSPITAL_COMMUNITY): Payer: Self-pay

## 2018-08-11 ENCOUNTER — Emergency Department (HOSPITAL_COMMUNITY)
Admission: EM | Admit: 2018-08-11 | Discharge: 2018-08-12 | Disposition: A | Discharge status: Home / Self Care | Payer: Medicaid Other | Attending: Emergency Medicine | Admitting: Emergency Medicine

## 2018-08-11 DIAGNOSIS — N410 Acute prostatitis: Secondary | ICD-10-CM | POA: Diagnosis not present

## 2018-08-11 DIAGNOSIS — M545 Low back pain: Secondary | ICD-10-CM | POA: Diagnosis present

## 2018-08-11 DIAGNOSIS — F17210 Nicotine dependence, cigarettes, uncomplicated: Secondary | ICD-10-CM | POA: Diagnosis not present

## 2018-08-11 DIAGNOSIS — Z79899 Other long term (current) drug therapy: Secondary | ICD-10-CM | POA: Diagnosis not present

## 2018-08-11 NOTE — ED Triage Notes (Signed)
Pt reports centralized lower back pain that started about 3 days ago. Denies specific injury, but states that he was lifting heavy boxes at work last week. Pt also states when he urinates that pain worsens as he empties his bladder.

## 2018-08-12 LAB — URINALYSIS, ROUTINE W REFLEX MICROSCOPIC
Bilirubin Urine: NEGATIVE
Glucose, UA: 50 mg/dL — AB
Hgb urine dipstick: NEGATIVE
Ketones, ur: 5 mg/dL — AB
Leukocytes,Ua: NEGATIVE
Nitrite: NEGATIVE
Protein, ur: 30 mg/dL — AB
Specific Gravity, Urine: 1.036 — ABNORMAL HIGH (ref 1.005–1.030)
pH: 5 (ref 5.0–8.0)

## 2018-08-12 MED ORDER — HYDROCODONE-ACETAMINOPHEN 5-325 MG PO TABS
1.0000 | ORAL_TABLET | Freq: Once | ORAL | Status: AC
  Administered 2018-08-12: 1 via ORAL
  Filled 2018-08-12: qty 1

## 2018-08-12 MED ORDER — SULFAMETHOXAZOLE-TRIMETHOPRIM 800-160 MG PO TABS: 1.0000 | ORAL_TABLET | Freq: Two times a day (BID) | ORAL | 0 refills | Status: AC

## 2018-08-12 MED ORDER — SULFAMETHOXAZOLE-TRIMETHOPRIM 800-160 MG PO TABS
1.0000 | ORAL_TABLET | Freq: Once | ORAL | Status: AC
  Administered 2018-08-12: 1 via ORAL
  Filled 2018-08-12: qty 1

## 2018-08-12 MED ORDER — IBUPROFEN 600 MG PO TABS: 600.0000 mg | ORAL_TABLET | Freq: Four times a day (QID) | ORAL | 0 refills | Status: AC | PRN

## 2018-08-12 MED ORDER — HYDROCODONE-ACETAMINOPHEN 5-325 MG PO TABS: 1.0000 | ORAL_TABLET | ORAL | 0 refills | Status: AC | PRN

## 2018-08-12 NOTE — Discharge Instructions (Signed)
Take medications as prescribed. Be sure to take the antibiotic for the full 14 days.   If your symptoms do not improve over the next week, call to make an appointment with urology.

## 2018-08-12 NOTE — ED Provider Notes (Signed)
[facility]-EMERGENCY DEPT Provider Note   CSN: [id number] Arrival date & time: 08/11/18  2337    History   Chief Complaint Chief Complaint  Patient presents with  . Back Pain    HPI Frederick Hutchinson is a 43 y.o. male.     Patient to ED for evaluation of pain in his lower back. He works for a [employer] where he was doing heavy lifting for several days this past week with progressive pain. He reports when he urinates the pain becomes worse. No difficulty starting or stopping a urinary stream. No hematuria, flank pain, testicular pain, penile discharge, fever or abdominal pain. When asked where his pain is located, he points to central buttock area below the sacrum. No falls or injury.  The history is provided by the patient. No language interpreter was used.  Back Pain  Associated symptoms: no abdominal pain, no fever, no numbness and no weakness     History reviewed. No pertinent past medical history.  There are no active problems to display for this patient.   Past Surgical History:  Procedure Laterality Date  . APPENDECTOMY          Home Medications    Prior to Admission medications   Medication Sig Start Date End Date Taking? Authorizing Provider  diphenoxylate-atropine (LOMOTIL) 2.5-0.025 MG per tablet Take 1 tablet by mouth 4 (four) times daily as needed for diarrhea or loose stools. 05/13/12   Donnetta Hutchingook, Brian, MD  loperamide (ANTI-DIARRHEAL) 2 MG tablet Take 2 mg by mouth 4 (four) times daily as needed for diarrhea or loose stools.    [provider]  Multiple Vitamin (MULTIVITAMIN WITH MINERALS) TABS Take 1 tablet by mouth every morning.    [provider]  promethazine (PHENERGAN) 25 MG tablet Take 1 tablet (25 mg total) by mouth every 6 (six) hours as needed for nausea. 05/13/12   Donnetta Hutchingook, Brian, MD    Family History History reviewed. No pertinent family history.  Social History Social History   Tobacco Use  . Smoking  status: Current Every Day Smoker    Packs/day: 0.50  Substance Use Topics  . Alcohol use: Yes    Comment: ocassionally  . Drug use: No     Allergies   Patient has no known allergies.   Review of Systems Review of Systems  Constitutional: Negative for fever.  HENT: Negative.   Respiratory: Negative.   Cardiovascular: Negative.   Gastrointestinal: Negative for abdominal pain and nausea.  Genitourinary: Negative for difficulty urinating, discharge, flank pain, hematuria, testicular pain and urgency.       See HPI.  Musculoskeletal: Positive for back pain.  Neurological: Negative for weakness and numbness.     Physical Exam Updated Vital Signs BP (!) 161/84 (BP Location: Right Arm)   Pulse 91   Temp 98.4 F (36.9 C) (Oral)   Resp 18   Ht 5\' 6"  (1.676 m)   Wt 68 kg   SpO2 100%   BMI 24.21 kg/m   Physical Exam Constitutional:      Appearance: He is well-developed.  Neck:     Musculoskeletal: Normal range of motion.  Pulmonary:     Effort: Pulmonary effort is normal.  Abdominal:     Palpations: Abdomen is soft.     Tenderness: There is no abdominal tenderness.  Genitourinary:    Comments: Tender, mildly enlarged prostate without palpable nodule or mass.  Musculoskeletal: Normal range of motion.  Skin:    General: Skin is warm  and dry.  Neurological:     Mental Status: He is alert and oriented to person, place, and time.      ED Treatments / Results  Labs (all labs ordered are listed, but only abnormal results are displayed) Labs Reviewed  URINALYSIS, ROUTINE W REFLEX MICROSCOPIC - Abnormal; Notable for the following components:      Result Value   Specific Gravity, Urine 1.036 (*)    Glucose, UA 50 (*)    Ketones, ur 5 (*)    Protein, ur 30 (*)    Bacteria, UA RARE (*)    All other components within normal limits  URINE CULTURE    EKG None  Radiology No results found.  Procedures Procedures (including critical care time)  Medications  Ordered in ED Medications  sulfamethoxazole-trimethoprim (BACTRIM DS) 800-160 MG per tablet 1 tablet (has no administration in time range)  HYDROcodone-acetaminophen (NORCO/VICODIN) 5-325 MG per tablet 1 tablet (1 tablet Oral Given 08/12/18 0052)     Initial Impression / Assessment and Plan / ED Course  I have reviewed the triage vital signs and the nursing notes.  Pertinent labs & imaging results that were available during my care of the patient were reviewed by me and considered in my medical decision making (see chart for details).        Patient to ED with complaint of pain in low back he feels is due to heavy lifting at work. He locates the pain to below the sacrum, and reports increased pain with emptying his bladder. No fever, abdominal pain, penile d/ch, testicular pain or hematuria.   He is uncomfortable appearing. Rectal exam is significantly uncomfortable for him and shows a mildly enlarged prostate gland. UA is grossly unremarkable for infection (rare bacteria, 0-5 WBCs). No fever.   Feel ss/sxs represent acute prostatitis. Will start Septra DS BID x 14 days. Refer to urology if symptoms not improving over the next week.   Final Clinical Impressions(s) / ED Diagnoses   Final diagnoses:  None   1. Acute prostatitis  ED Discharge Orders    None       Elpidio Anis, PA-C 08/12/18 8032    Ward, Layla Maw, DO 08/12/18 [phone]

## 2018-08-13 LAB — URINE CULTURE: Culture: NO GROWTH

## 2020-02-16 ENCOUNTER — Encounter (HOSPITAL_COMMUNITY): Payer: Self-pay

## 2020-02-16 ENCOUNTER — Emergency Department (HOSPITAL_COMMUNITY)
Admission: EM | Admit: 2020-02-16 | Discharge: 2020-02-16 | Disposition: A | Discharge status: Home / Self Care | Payer: Medicaid Other | Attending: Emergency Medicine | Admitting: Emergency Medicine

## 2020-02-16 ENCOUNTER — Emergency Department (HOSPITAL_COMMUNITY): Payer: Medicaid Other

## 2020-02-16 ENCOUNTER — Other Ambulatory Visit: Payer: Self-pay

## 2020-02-16 DIAGNOSIS — F17200 Nicotine dependence, unspecified, uncomplicated: Secondary | ICD-10-CM | POA: Insufficient documentation

## 2020-02-16 DIAGNOSIS — M546 Pain in thoracic spine: Secondary | ICD-10-CM | POA: Insufficient documentation

## 2020-02-16 MED ORDER — DICLOFENAC SODIUM 1 % EX GEL: 2.0000 g | Freq: Four times a day (QID) | CUTANEOUS | 0 refills | Status: AC

## 2020-02-16 MED ORDER — METHOCARBAMOL 500 MG PO TABS: 500.0000 mg | ORAL_TABLET | Freq: Two times a day (BID) | ORAL | 0 refills | Status: DC
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
## 2020-02-16 NOTE — Discharge Instructions (Addendum)
Your xray did not show any signs of fractures. Your pain is likely musculoskeletal in nature given your recent fall. Please pick up medication and take as prescribed. DO NOT DRIVE WHILE ON THE MUSCLE RELAXER AS IT CAN MAKE YOU DROWSY. I would recommend taking these at nighttime to help you sleep.   Follow up with your PCP regarding your ED visit. If you do not have one you can follow up with [facility] for primary care needs.   Return to the ED for any worsening symptoms

## 2021-09-06 DIAGNOSIS — Z720 Tobacco use: Secondary | ICD-10-CM | POA: Insufficient documentation

## 2021-09-06 DIAGNOSIS — R7303 Prediabetes: Secondary | ICD-10-CM | POA: Insufficient documentation

## 2021-09-06 DIAGNOSIS — E785 Hyperlipidemia, unspecified: Secondary | ICD-10-CM | POA: Insufficient documentation

## 2022-07-16 ENCOUNTER — Emergency Department (HOSPITAL_COMMUNITY)
Admission: EM | Admit: 2022-07-16 | Discharge: 2022-07-16 | Disposition: A | Discharge status: Home / Self Care | Payer: Medicaid Other | Attending: Emergency Medicine | Admitting: Emergency Medicine

## 2022-07-16 ENCOUNTER — Encounter (HOSPITAL_COMMUNITY): Payer: Self-pay

## 2022-07-16 DIAGNOSIS — Z4802 Encounter for removal of sutures: Secondary | ICD-10-CM | POA: Insufficient documentation

## 2022-07-16 MED ORDER — CEPHALEXIN 500 MG PO CAPS: 500.0000 mg | ORAL_CAPSULE | Freq: Two times a day (BID) | ORAL | 0 refills | Status: AC

## 2022-07-16 NOTE — ED Triage Notes (Signed)
Pt arrives today after getting sutures on 4/4 and requests removal. Denies any pain or fevers.

## 2022-07-16 NOTE — ED Provider Notes (Signed)
[place] EMERGENCY DEPARTMENT AT [facility] Provider Note   CSN: [id number] Arrival date & time: 07/16/22  4098     History  Chief Complaint  Patient presents with   Suture / Staple Removal    Frederick Hutchinson is a 47 y.o. male here for suture removal, had an injury on the third was seen in the ER and had sutures placed in a dogear type injury and to the left lower extremity.  He has been applying bacitracin at home.  No fevers or chills.  HPI     Home Medications Prior to Admission medications   Medication Sig Start Date End Date Taking? Authorizing Provider  cephALEXin (KEFLEX) 500 MG capsule Take 1 capsule (500 mg total) by mouth 2 (two) times daily for 5 days. 07/17/22 07/22/22 Yes Hau Sanor, Kermit Balo, MD  diclofenac Sodium (VOLTAREN) 1 % GEL Apply 2 g topically 4 (four) times daily. 02/16/20   Hyman Hopes, Margaux, PA-C  diphenoxylate-atropine (LOMOTIL) 2.5-0.025 MG per tablet Take 1 tablet by mouth 4 (four) times daily as needed for diarrhea or loose stools. 05/13/12   Donnetta Hutching, MD  HYDROcodone-acetaminophen (NORCO/VICODIN) 5-325 MG tablet Take 1 tablet by mouth every 4 (four) hours as needed for severe pain. 08/12/18   Elpidio Anis, PA-C  ibuprofen (ADVIL) 600 MG tablet Take 1 tablet (600 mg total) by mouth every 6 (six) hours as needed. 08/12/18   Elpidio Anis, PA-C  loperamide (ANTI-DIARRHEAL) 2 MG tablet Take 2 mg by mouth 4 (four) times daily as needed for diarrhea or loose stools.    [provider]  methocarbamol (ROBAXIN) 500 MG tablet Take 1 tablet (500 mg total) by mouth 2 (two) times daily. 02/16/20   Tanda Rockers, PA-C  Multiple Vitamin (MULTIVITAMIN WITH MINERALS) TABS Take 1 tablet by mouth every morning.    [provider]  promethazine (PHENERGAN) 25 MG tablet Take 1 tablet (25 mg total) by mouth every 6 (six) hours as needed for nausea. 05/13/12   Donnetta Hutching, MD      Allergies    Patient has no known allergies.    Review of  Systems   Review of Systems  Physical Exam Updated Vital Signs BP (!) 146/77 (BP Location: Left Arm)   Pulse 83   Temp 98 F (36.7 C) (Oral)   Resp 18   Ht  (1.676 m)   Wt 70.8 kg   SpO2 100%   BMI 25.18 kg/m  Physical Exam Constitutional:      General: He is not in acute distress. HENT:     Head: Normocephalic and atraumatic.  Eyes:     Conjunctiva/sclera: Conjunctivae normal.     Pupils: Pupils are equal, round, and reactive to light.  Cardiovascular:     Rate and Rhythm: Normal rate and regular rhythm.  Pulmonary:     Effort: Pulmonary effort is normal. No respiratory distress.  Skin:    General: Skin is warm and dry.     Comments: Patient is a curvilinear laceration that is scabbed, mildly gaping, with some very mild surrounding erythema of the left lower extremity  Neurological:     General: No focal deficit present.     Mental Status: He is alert. Mental status is at baseline.  Psychiatric:        Mood and Affect: Mood normal.        Behavior: Behavior normal.     ED Results / Procedures / Treatments   Labs (all labs ordered are listed,  but only abnormal results are displayed) Labs Reviewed - No data to display  EKG None  Radiology No results found.  Procedures .Suture Removal  Date/Time: 07/16/2022 9:53 AM  Performed by: Terald Sleeper, MD Authorized by: Terald Sleeper, MD   Consent:    Consent obtained:  Verbal   Consent given by:  Patient   Risks discussed:  Bleeding, pain and wound separation Universal protocol:    Procedure explained and questions answered to patient or proxy's satisfaction: yes     Immediately prior to procedure, a time out was called: yes     Patient identity confirmed:  Arm band Location:    Location:  Lower extremity   Lower extremity location:  Leg   Leg location:  L lower leg Procedure details:    Wound appearance:  Red   Number of sutures removed:  6 Post-procedure details:    Post-removal:  Dressing  applied   Procedure completion:  Tolerated well, no immediate complications     Medications Ordered in ED Medications - No data to display  ED Course/ Medical Decision Making/ A&P                             Medical Decision Making  Sutures were successfully removed at the bedside. The wound does remain gaping, appears to be slowly healing.  I do not see clear evidence of infection at this time, but did advise that if he were to develop redness or erythema or worsening drainage, he can fill the antibiotic prescription I provided.  He and his wife verbalized understanding        Final Clinical Impression(s) / ED Diagnoses Final diagnoses:  Visit for suture removal    Rx / DC Orders ED Discharge Orders          Ordered    cephALEXin (KEFLEX) 500 MG capsule  2 times daily        07/16/22 0953              Terald Sleeper, MD 07/16/22 [phone]

## 2023-07-22 ENCOUNTER — Encounter (HOSPITAL_COMMUNITY): Payer: Self-pay | Admitting: Emergency Medicine

## 2023-07-22 ENCOUNTER — Other Ambulatory Visit: Payer: Self-pay

## 2023-07-22 ENCOUNTER — Emergency Department (HOSPITAL_COMMUNITY): Payer: Self-pay

## 2023-07-22 ENCOUNTER — Emergency Department (HOSPITAL_COMMUNITY)
Admission: EM | Admit: 2023-07-22 | Discharge: 2023-07-22 | Disposition: A | Discharge status: Home / Self Care | Payer: Self-pay | Attending: Emergency Medicine | Admitting: Emergency Medicine

## 2023-07-22 DIAGNOSIS — R079 Chest pain, unspecified: Secondary | ICD-10-CM

## 2023-07-22 DIAGNOSIS — R0789 Other chest pain: Secondary | ICD-10-CM | POA: Insufficient documentation

## 2023-07-22 LAB — BASIC METABOLIC PANEL WITH GFR
Anion gap: 9 (ref 5–15)
BUN: 13 mg/dL (ref 6–20)
CO2: 23 mmol/L (ref 22–32)
Calcium: 9.4 mg/dL (ref 8.9–10.3)
Chloride: 108 mmol/L (ref 98–111)
Creatinine, Ser: 0.88 mg/dL (ref 0.61–1.24)
GFR, Estimated: >60 mL/min (ref >60)
Glucose, Bld: 106 mg/dL — ABNORMAL HIGH (ref 70–99)
Potassium: 4.2 mmol/L (ref 3.5–5.1)
Sodium: 140 mmol/L (ref 135–145)

## 2023-07-22 LAB — CBC
HCT: 44.5 % (ref 39.0–52.0)
Hemoglobin: 14.8 g/dL (ref 13.0–17.0)
MCH: 32 pg (ref 26.0–34.0)
MCHC: 33.3 g/dL (ref 30.0–36.0)
MCV: 96.3 fL (ref 80.0–100.0)
Platelets: 245 10*3/uL (ref 150–400)
RBC: 4.62 MIL/uL (ref 4.22–5.81)
RDW: 13 % (ref 11.5–15.5)
WBC: 7.9 10*3/uL (ref 4.0–10.5)
nRBC: 0 % (ref 0.0–0.2)

## 2023-07-22 LAB — MAGNESIUM: Magnesium: 1.9 mg/dL (ref 1.7–2.4)

## 2023-07-22 LAB — TROPONIN I (HIGH SENSITIVITY)
Troponin I (High Sensitivity): <2 ng/L (ref <18)
Troponin I (High Sensitivity): <2 ng/L (ref <18)

## 2023-07-22 MED ORDER — KETOROLAC TROMETHAMINE 15 MG/ML IJ SOLN
15.0000 mg | Freq: Once | INTRAMUSCULAR | Status: AC
  Administered 2023-07-22: 15 mg via INTRAVENOUS
  Filled 2023-07-22: qty 1

## 2023-07-22 MED ORDER — SODIUM CHLORIDE (PF) 0.9 % IJ SOLN
INTRAMUSCULAR | Status: AC
  Filled 2023-07-22: qty 50

## 2023-07-22 MED ORDER — METHOCARBAMOL 500 MG PO TABS
1000.0000 mg | ORAL_TABLET | Freq: Once | ORAL | Status: AC
  Administered 2023-07-22: 1000 mg via ORAL
  Filled 2023-07-22: qty 2

## 2023-07-22 MED ORDER — METHOCARBAMOL 500 MG PO TABS
500.0000 mg | ORAL_TABLET | Freq: Two times a day (BID) | ORAL | 0 refills | Status: AC
Start: 2023-07-22 — End: ?

## 2023-07-22 MED ORDER — IOHEXOL 350 MG/ML SOLN
75.0000 mL | Freq: Once | INTRAVENOUS | Status: AC | PRN
  Administered 2023-07-22: 75 mL via INTRAVENOUS

## 2023-07-22 NOTE — ED Provider Notes (Signed)
 Assumed care of patient from off-going team. For more details, please see note from same day.  In brief, this is a 48 y.o. male who presents with left sided chest pain. Neg trop x 2, well-appearing.  Plan/Dispo at time of sign-out & ED Course since sign-out: [ ]  CTA  BP 119/80   Pulse (!) 58   Temp 98.2 F (36.8 C) (Oral)   Resp 13   Ht 5\' 5"  (1.651 m)   Wt 65.8 kg   SpO2 99%   BMI 24.13 kg/m    ED Course:   Clinical Course as of 07/22/23 1750  Mon Jul 22, 2023  1749 Patient reevaluated. He feels improved. Patient's workup very reassuring and he is well-appearing. Heart score low. Advised to f/u with PCP within 1 week. Requests robaxin  rx for home. Advised also ibuprofen /tylenol  at home. DC w/ discharge instructions/return precautions. All questions answered to patient's satisfaction.   [HN]    Clinical Course User Index [HN] Merdis Stalling, MD    Dispo:  ------------------------------- Annita Kindle, MD Emergency Medicine  This note was created using dictation software, which may contain spelling or grammatical errors.   Merdis Stalling, MD 07/22/23 [phone]

## 2023-07-22 NOTE — Discharge Instructions (Addendum)
 Thank you for coming to [facility] Emergency Department. You were seen for chest pain. We did an exam, labs, and imaging, and these showed no acute findings. You can alternate taking Tylenol  and ibuprofen  as needed for pain. You can take 650mg  tylenol  (acetaminophen ) every 4-6 hours, and 600 mg ibuprofen  3 times a day. You can also take methocarbamol  (robaxin ) 500 mg twice per day as needed.  Please follow up with your primary care provider within 1 week.   Do not hesitate to return to the ED or call 911 if you experience: -Worsening symptoms -Lightheadedness, passing out -Fevers/chills -Anything else that concerns you

## 2023-07-22 NOTE — ED Triage Notes (Signed)
 Pt reports left sided chest pain that started 2 days ago. Pt reports that this morning while making breakfast he almost fainted. Pt does report SHOB "on and off."

## 2023-07-22 NOTE — ED Provider Notes (Signed)
 [place] EMERGENCY DEPARTMENT AT [facility] Provider Note   CSN: [id number] Arrival date & time: 07/22/23  0844     History  Chief Complaint  Patient presents with   Chest Pain    Frederick Hutchinson is a 48 y.o. male.   Chest Pain Patient presents for chest pain.  He has no known medical conditions.  2 days ago, he had onset of left-sided chest pain.  He denies any prior injuries or straining activities.  Pain is described as a squeezing sensation.  It is worsened with stretching and muscle activation on that side of his chest.  It is worsened with palpation.  Pain has been intermittent.  When he does have the pain, he describes a difficulty with taking a deep inspiration.  He is taking ibuprofen  for the pain.  This morning, while making breakfast, he had a near syncopal episode, described as feeling lightheaded and clammy.  He sat down in the chair and allowed it several minutes to pass.  He states that he has had near syncopal episodes before.  He has never fully passed out.  2 hours prior to arrival, he took 800 mg ibuprofen , as well as his wife's gabapentin and tizanidine.  He states that this has mildly improved his symptoms.  Current pain is mild.  Denies any other current symptoms.     Home Medications Prior to Admission medications   Medication Sig Start Date End Date Taking? Authorizing Provider  diclofenac  Sodium (VOLTAREN ) 1 % GEL Apply 2 g topically 4 (four) times daily. 02/16/20   Margarete Sharps, Margaux, PA-C  diphenoxylate -atropine  (LOMOTIL ) 2.5-0.025 MG per tablet Take 1 tablet by mouth 4 (four) times daily as needed for diarrhea or loose stools. 05/13/12   Latanya Poisson, MD  HYDROcodone -acetaminophen  (NORCO/VICODIN) 5-325 MG tablet Take 1 tablet by mouth every 4 (four) hours as needed for severe pain. 08/12/18   Mandy Second, PA-C  ibuprofen  (ADVIL ) 600 MG tablet Take 1 tablet (600 mg total) by mouth every 6 (six) hours as needed. 08/12/18   Mandy Second, PA-C   loperamide (ANTI-DIARRHEAL) 2 MG tablet Take 2 mg by mouth 4 (four) times daily as needed for diarrhea or loose stools.    [provider]  methocarbamol  (ROBAXIN ) 500 MG tablet Take 1 tablet (500 mg total) by mouth 2 (two) times daily. 02/16/20   Venter, Margaux, PA-C  Multiple Vitamin (MULTIVITAMIN WITH MINERALS) TABS Take 1 tablet by mouth every morning.    [provider]  promethazine  (PHENERGAN ) 25 MG tablet Take 1 tablet (25 mg total) by mouth every 6 (six) hours as needed for nausea. 05/13/12   Latanya Poisson, MD      Allergies    Patient has no known allergies.    Review of Systems   Review of Systems  Cardiovascular:  Positive for chest pain.  Neurological:  Positive for light-headedness.  All other systems reviewed and are negative.   Physical Exam Updated Vital Signs BP 118/80   Pulse (!) 57   Temp 98.2 F (36.8 C) (Oral)   Resp 20   Ht 5\' 5"  (1.651 m)   Wt 65.8 kg   SpO2 100%   BMI 24.13 kg/m  Physical Exam Vitals and nursing note reviewed.  Constitutional:      General: He is not in acute distress.    Appearance: He is well-developed. He is not ill-appearing, toxic-appearing or diaphoretic.  HENT:     Head: Normocephalic and atraumatic.  Eyes:  Extraocular Movements: Extraocular movements intact.     Conjunctiva/sclera: Conjunctivae normal.  Cardiovascular:     Rate and Rhythm: Normal rate and regular rhythm.     Heart sounds: No murmur heard. Pulmonary:     Effort: Pulmonary effort is normal. No tachypnea or respiratory distress.     Breath sounds: Normal breath sounds. No decreased breath sounds, wheezing, rhonchi or rales.  Chest:     Chest wall: Tenderness present.  Abdominal:     Palpations: Abdomen is soft.     Tenderness: There is no abdominal tenderness.  Musculoskeletal:        General: No swelling. Normal range of motion.     Cervical back: Normal range of motion and neck supple.     Right lower leg: No edema.     Left  lower leg: No edema.  Skin:    General: Skin is warm and dry.     Coloration: Skin is not cyanotic or pale.  Neurological:     General: No focal deficit present.     Mental Status: He is alert and oriented to person, place, and time.  Psychiatric:        Mood and Affect: Mood normal.        Behavior: Behavior normal.     ED Results / Procedures / Treatments   Labs (all labs ordered are listed, but only abnormal results are displayed) Labs Reviewed  BASIC METABOLIC PANEL WITH GFR - Abnormal; Notable for the following components:      Result Value   Glucose, Bld 106 (*)    All other components within normal limits  CBC  MAGNESIUM  TROPONIN I (HIGH SENSITIVITY)  TROPONIN I (HIGH SENSITIVITY)    EKG EKG Interpretation Date/Time:  Monday July 22 2023 08:53:40 EDT Ventricular Rate:  65 PR Interval:  182 QRS Duration:  78 QT Interval:  388 QTC Calculation: 404 R Axis:   71  Text Interpretation: Sinus rhythm Confirmed by Iva Mariner [phone]) on 07/22/2023 9:12:59 AM  Radiology DG Chest 2 View Result Date: 07/22/2023 CLINICAL DATA:  cp EXAM: CHEST - 2 VIEW COMPARISON:  February 16, 2020 FINDINGS: No focal airspace consolidation, pleural effusion, or pneumothorax. No cardiomegaly. No acute fracture or destructive lesion. Multilevel thoracic osteophytosis. IMPRESSION: No acute cardiopulmonary abnormality. Electronically Signed   By: Rance Burrows M.D.   On: 07/22/2023 11:03    Procedures Procedures    Medications Ordered in ED Medications  iohexol  (OMNIPAQUE ) 350 MG/ML injection 75 mL (75 mLs Intravenous Contrast Given 07/22/23 1358)  ketorolac  (TORADOL ) 15 MG/ML injection 15 mg (15 mg Intravenous Given 07/22/23 1538)  methocarbamol  (ROBAXIN ) tablet 1,000 mg (1,000 mg Oral Given 07/22/23 1538)    ED Course/ Medical Decision Making/ A&P                                 Medical Decision Making Amount and/or Complexity of Data Reviewed Labs: ordered. Radiology:  ordered.  Risk Prescription drug management.   This patient presents to the ED for concern of chest pain, this involves an extensive number of treatment options, and is a complaint that carries with it a high risk of complications and morbidity.  The differential diagnosis includes ACS, PE, pneumonia, chest wall inflammation, pleural effusion   Co morbidities that complicate the patient evaluation  N/A   Additional history obtained:  Additional history obtained from N/A External records from outside source obtained and reviewed  including EMR   Lab Tests:  I Ordered, and personally interpreted labs.  The pertinent results include: Kidney function, normal electrolytes, normal hemoglobin, no leukocytosis, normal troponins x 2   Imaging Studies ordered:  I ordered imaging studies including x-ray, CTA chest I independently visualized and interpreted imaging which showed acute findings on x-ray, CTA pending at time of signout I agree with the radiologist interpretation   Cardiac Monitoring: / EKG:  The patient was maintained on a cardiac monitor.  I personally viewed and interpreted the cardiac monitored which showed an underlying rhythm of: Sinus rhythm   Problem List / ED Course / Critical interventions / Medication management  Patient presenting for left-sided chest pain, intermittent over the past 2 days.  Additionally, he had an episode of near syncope this morning described as lightheadedness and clamminess.  On arrival in the ED, his vital signs are normal.  His pain is improved after taking ibuprofen , gabapentin, and tizanidine at home.  He is well-appearing on exam.  No cardiac murmurs or rubs are appreciated on auscultation.  His breathing is unlabored.  Lungs are clear.  He has significant tenderness to the left side of his anterior chest musculature.  He declines any pain medication at this time.  EKG does not show any concerning ST segment abnormalities.  Workup was  initiated.  Lab work is reassuring.  Patient remained in normal sinus rhythm while in the ED.  Patient did ultimately request some pain medication.  Toradol  and Robaxin  were ordered.  CTA was pending at time of signout.  Care of patient was signed out to oncoming ED provider. I ordered medication including Toradol  and Robaxin  for analgesia Reevaluation of the patient after these medicines showed that the patient improved I have reviewed the patients home medicines and have made adjustments as needed   Social Determinants of Health:  Lives independently        Final Clinical Impression(s) / ED Diagnoses Final diagnoses:  Chest pain, unspecified type    Rx / DC Orders ED Discharge Orders     None         Iva Mariner, MD 07/22/23 1653

## 2023-12-04 ENCOUNTER — Other Ambulatory Visit: Payer: Self-pay

## 2023-12-04 ENCOUNTER — Emergency Department (HOSPITAL_COMMUNITY): Payer: Self-pay

## 2023-12-04 ENCOUNTER — Encounter (HOSPITAL_COMMUNITY): Payer: Self-pay

## 2023-12-04 ENCOUNTER — Emergency Department (HOSPITAL_COMMUNITY)
Admission: EM | Admit: 2023-12-04 | Discharge: 2023-12-04 | Disposition: A | Discharge status: Home / Self Care | Payer: Self-pay | Attending: Emergency Medicine | Admitting: Emergency Medicine

## 2023-12-04 DIAGNOSIS — R109 Unspecified abdominal pain: Secondary | ICD-10-CM

## 2023-12-04 DIAGNOSIS — R1032 Left lower quadrant pain: Secondary | ICD-10-CM | POA: Insufficient documentation

## 2023-12-04 DIAGNOSIS — R1012 Left upper quadrant pain: Secondary | ICD-10-CM | POA: Insufficient documentation

## 2023-12-04 LAB — COMPREHENSIVE METABOLIC PANEL WITH GFR
ALT: 16 U/L (ref 0–44)
AST: 21 U/L (ref 15–41)
Albumin: 4.4 g/dL (ref 3.5–5.0)
Alkaline Phosphatase: 68 U/L (ref 38–126)
Anion gap: 12 (ref 5–15)
BUN: 11 mg/dL (ref 6–20)
CO2: 24 mmol/L (ref 22–32)
Calcium: 9.8 mg/dL (ref 8.9–10.3)
Chloride: 106 mmol/L (ref 98–111)
Creatinine, Ser: 0.9 mg/dL (ref 0.61–1.24)
GFR, Estimated: >60 mL/min (ref >60)
Glucose, Bld: 104 mg/dL — ABNORMAL HIGH (ref 70–99)
Potassium: 4.3 mmol/L (ref 3.5–5.1)
Sodium: 141 mmol/L (ref 135–145)
Total Bilirubin: 0.3 mg/dL (ref 0.0–1.2)
Total Protein: 7 g/dL (ref 6.5–8.1)

## 2023-12-04 LAB — CBC
HCT: 45.5 % (ref 39.0–52.0)
Hemoglobin: 15 g/dL (ref 13.0–17.0)
MCH: 31.1 pg (ref 26.0–34.0)
MCHC: 33 g/dL (ref 30.0–36.0)
MCV: 94.4 fL (ref 80.0–100.0)
Platelets: 255 K/uL (ref 150–400)
RBC: 4.82 MIL/uL (ref 4.22–5.81)
RDW: 13 % (ref 11.5–15.5)
WBC: 8.6 K/uL (ref 4.0–10.5)
nRBC: 0 % (ref 0.0–0.2)

## 2023-12-04 LAB — URINALYSIS, ROUTINE W REFLEX MICROSCOPIC
Bilirubin Urine: NEGATIVE
Glucose, UA: NEGATIVE mg/dL
Hgb urine dipstick: NEGATIVE
Ketones, ur: NEGATIVE mg/dL
Leukocytes,Ua: NEGATIVE
Nitrite: NEGATIVE
Protein, ur: NEGATIVE mg/dL
Specific Gravity, Urine: 1.009 (ref 1.005–1.030)
pH: 7 (ref 5.0–8.0)

## 2023-12-04 LAB — LIPASE, BLOOD: Lipase: 38 U/L (ref 11–51)

## 2023-12-04 MED ORDER — SODIUM CHLORIDE 0.9 % IV BOLUS
1000.0000 mL | Freq: Once | INTRAVENOUS | Status: AC
  Administered 2023-12-04: 1000 mL via INTRAVENOUS

## 2023-12-04 MED ORDER — ONDANSETRON HCL 4 MG PO TABS: 4.0000 mg | ORAL_TABLET | Freq: Four times a day (QID) | ORAL | 0 refills | Status: AC

## 2023-12-04 MED ORDER — IOHEXOL 300 MG/ML  SOLN
100.0000 mL | Freq: Once | INTRAMUSCULAR | Status: AC | PRN
  Administered 2023-12-04: 100 mL via INTRAVENOUS

## 2023-12-04 MED ORDER — MORPHINE SULFATE (PF) 4 MG/ML IV SOLN
4.0000 mg | Freq: Once | INTRAVENOUS | Status: AC
  Administered 2023-12-04: 4 mg via INTRAVENOUS
  Filled 2023-12-04: qty 1

## 2023-12-04 MED ORDER — ONDANSETRON HCL 4 MG/2ML IJ SOLN
4.0000 mg | Freq: Once | INTRAMUSCULAR | Status: AC
  Administered 2023-12-04: 4 mg via INTRAVENOUS
  Filled 2023-12-04: qty 2

## 2023-12-04 NOTE — ED Notes (Signed)
ED PA at [facility] 

## 2023-12-04 NOTE — ED Triage Notes (Signed)
 Pt states he has been left side abdominal pain for the past week week with some vomiting and diarrhea. States its a gripping pain and goes around to his lower back.

## 2023-12-04 NOTE — ED Provider Notes (Signed)
 [place] [facility] AT [facility] Provider Note   CSN: [id number] Arrival date & time: 12/04/23  9249     Patient presents with: Abdominal Pain  HPI Frederick Hutchinson is a 48 y.o. male presenting for abdominal pain.  Has been going on for a week.  It is in the left side of his abdomen and nonradiating otherwise.  Endorsing associated nausea vomiting and diarrhea.  Endorses increased urinary frequency as well but no other urinary symptoms.  Has had an appendectomy.  Symptoms have been worse the last couple days.  Endorses daily marijuana use but no recent travel.    Abdominal Pain      Prior to Admission medications   Medication Sig Start Date End Date Taking? Authorizing Provider  ondansetron  (ZOFRAN ) 4 MG tablet Take 1 tablet (4 mg total) by mouth every 6 (six) hours. 12/04/23  Yes Verania Salberg K, PA-C  diclofenac  Sodium (VOLTAREN ) 1 % GEL Apply 2 g topically 4 (four) times daily. 02/16/20   Shepard, Margaux, PA-C  diphenoxylate -atropine  (LOMOTIL ) 2.5-0.025 MG per tablet Take 1 tablet by mouth 4 (four) times daily as needed for diarrhea or loose stools. 05/13/12   Bluford Rogue, MD  HYDROcodone -acetaminophen  (NORCO/VICODIN) 5-325 MG tablet Take 1 tablet by mouth every 4 (four) hours as needed for severe pain. 08/12/18   Odell Balls, PA-C  ibuprofen  (ADVIL ) 600 MG tablet Take 1 tablet (600 mg total) by mouth every 6 (six) hours as needed. 08/12/18   Odell Balls, PA-C  loperamide (ANTI-DIARRHEAL) 2 MG tablet Take 2 mg by mouth 4 (four) times daily as needed for diarrhea or loose stools.    [provider]  methocarbamol  (ROBAXIN ) 500 MG tablet Take 1 tablet (500 mg total) by mouth 2 (two) times daily. 07/22/23   Franklyn Sid SAILOR, MD  Multiple Vitamin (MULTIVITAMIN WITH MINERALS) TABS Take 1 tablet by mouth every morning.    [provider]  promethazine  (PHENERGAN ) 25 MG tablet Take 1 tablet (25 mg total) by mouth every 6 (six) hours as needed for  nausea. 05/13/12   Bluford Rogue, MD    Allergies: Patient has no known allergies.    Review of Systems  Gastrointestinal:  Positive for abdominal pain.    Updated Vital Signs BP (!) 95/54   Pulse (!) 53   Temp 98.2 F (36.8 C) (Oral)   Resp 20   SpO2 100%   Physical Exam Vitals and nursing note reviewed.  HENT:     Head: Normocephalic and atraumatic.     Mouth/Throat:     Mouth: Mucous membranes are moist.  Eyes:     General:        Right eye: No discharge.        Left eye: No discharge.     Conjunctiva/sclera: Conjunctivae normal.  Cardiovascular:     Rate and Rhythm: Normal rate and regular rhythm.     Pulses: Normal pulses.     Heart sounds: Normal heart sounds.  Pulmonary:     Effort: Pulmonary effort is normal.     Breath sounds: Normal breath sounds.  Abdominal:     General: Abdomen is flat.     Palpations: Abdomen is soft.     Tenderness: There is abdominal tenderness in the left upper quadrant and left lower quadrant. There is left CVA tenderness.  Skin:    General: Skin is warm and dry.  Neurological:     General: No focal deficit present.  Psychiatric:  Mood and Affect: Mood normal.     (all labs ordered are listed, but only abnormal results are displayed) Labs Reviewed  COMPREHENSIVE METABOLIC PANEL WITH GFR - Abnormal; Notable for the following components:      Result Value   Glucose, Bld 104 (*)    All other components within normal limits  URINALYSIS, ROUTINE W REFLEX MICROSCOPIC - Abnormal; Notable for the following components:   Color, Urine STRAW (*)    All other components within normal limits  LIPASE, BLOOD  CBC    EKG: None  Radiology: CT ABDOMEN PELVIS W CONTRAST Result Date: 12/04/2023 CLINICAL DATA:  Acute left-sided abdominal pain, vomiting, diarrhea. EXAM: CT ABDOMEN AND PELVIS WITH CONTRAST TECHNIQUE: Multidetector CT imaging of the abdomen and pelvis was performed using the standard protocol following bolus administration  of intravenous contrast. RADIATION DOSE REDUCTION: This exam was performed according to the departmental dose-optimization program which includes automated exposure control, adjustment of the mA and/or kV according to patient size and/or use of iterative reconstruction technique. CONTRAST:  OMNIPAQUE  IOHEXOL  300 MG/ML  SOLN COMPARISON:  February 13, 2004. FINDINGS: Lower chest: No acute abnormality. Hepatobiliary: No focal liver abnormality is seen. No gallstones, gallbladder wall thickening, or biliary dilatation. Pancreas: Unremarkable. No pancreatic ductal dilatation or surrounding inflammatory changes. Spleen: Normal in size without focal abnormality. Adrenals/Urinary Tract: Adrenal glands appear normal. 4 mm nonobstructive calculus seen in lower pole collecting system of right kidney. No hydronephrosis or renal obstruction is noted. Urinary bladder is unremarkable. Stomach/Bowel: Stomach is unremarkable. Status post appendectomy. No evidence of bowel obstruction or inflammation. Vascular/Lymphatic: No significant vascular findings are present. No enlarged abdominal or pelvic lymph nodes. Reproductive: Prostate is unremarkable. Other: No abdominal wall hernia or abnormality. No abdominopelvic ascites. Musculoskeletal: No acute or significant osseous findings. IMPRESSION: 1. Nonobstructive right nephrolithiasis. No hydronephrosis or renal obstruction is noted. 2. No other abnormality seen in the abdomen or pelvis. Electronically Signed   By: Lynwood Landy Raddle M.D.   On: 12/04/2023 10:26     Procedures   Medications Ordered in the ED  sodium chloride  0.9 % bolus 1,000 mL (0 mLs Intravenous Stopped 12/04/23 0941)  ondansetron  (ZOFRAN ) injection 4 mg (4 mg Intravenous Given 12/04/23 0853)  morphine  (PF) 4 MG/ML injection 4 mg (4 mg Intravenous Given 12/04/23 0853)  iohexol  (OMNIPAQUE ) 300 MG/ML solution 100 mL (100 mLs Intravenous Contrast Given 12/04/23 0941)                                    Medical  Decision Making Amount and/or Complexity of Data Reviewed Labs: ordered. Radiology: ordered.  Risk Prescription drug management.   Initial Impression and Ddx 48 year old well-appearing male presenting for abdominal pain.  Exam notable for left-sided abdominal tenderness.  DDx includes diverticulitis, kidney stone, pyelonephritis, bowel obstruction, CHS, less likely ACS, other. Patient PMH that increases complexity of ED encounter:  none  Interpretation of Diagnostics - I independent reviewed and interpreted the labs as followed: none  - I independently visualized the following imaging with scope of interpretation limited to determining acute life threatening conditions related to emergency care: CT, which revealed right side head nonobstructing nephrolithiasis.  Shared findings with patient  Patient Reassessment and Ultimate Disposition/Management On reassessment symptoms improved, no witnessed vomiting here.  Fluid challenge without issue.  Suspect could be related to marijuana use, may be mild form of CHS.  Advised assertive hydration at home,  send Zofran  to his pharmacy and PCP follow-up.  Discussed return precautions.  Discharged in good condition.  Kidney stone on the right is likely incidental finding given no right sided pain.  Patient management required discussion with the following services or consulting groups:  None  Complexity of Problems Addressed Acute complicated illness or Injury  Additional Data Reviewed and Analyzed Further history obtained from: Past medical history and medications listed in the EMR and Prior ED visit notes  Patient Encounter Risk Assessment Consideration of hospitalization      Final diagnoses:  Abdominal pain, unspecified abdominal location    ED Discharge Orders          Ordered    ondansetron  (ZOFRAN ) 4 MG tablet  Every 6 hours        12/04/23 1038               Tammie Ellsworth K, PA-C 12/04/23 1039    Bernard Drivers,  MD 12/05/23 1007

## 2023-12-04 NOTE — Discharge Instructions (Addendum)
 Evaluation patient today for your abdominal pain was overall reassuring.  I sent Zofran  to your pharmacy to help with nausea and vomiting.  Suspect this could be related to your marijuana use.  Please follow-up with your PCP.  If your symptoms worsen, you develop a fever, or any other concerning symptom please return to the ED for further evaluation.
